# Patient Record
Sex: Female | Born: 2000 | Race: Black or African American | Hispanic: No | Marital: Single | Smoking: Current every day smoker
Health system: Southern US, Community
[De-identification: ages and names within clinical notes are randomized; demographics above are authoritative.]

## PROBLEM LIST (undated history)

## (undated) DIAGNOSIS — F909 Attention-deficit hyperactivity disorder, unspecified type: Secondary | ICD-10-CM

## (undated) DIAGNOSIS — Z309 Encounter for contraceptive management, unspecified: Secondary | ICD-10-CM

## (undated) DIAGNOSIS — N898 Other specified noninflammatory disorders of vagina: Principal | ICD-10-CM

## (undated) DIAGNOSIS — A749 Chlamydial infection, unspecified: Secondary | ICD-10-CM

## (undated) HISTORY — DX: Chlamydial infection, unspecified: A74.9

## (undated) HISTORY — DX: Encounter for contraceptive management, unspecified: Z30.9

## (undated) HISTORY — PX: INDUCED ABORTION: SHX677

## (undated) HISTORY — DX: Other specified noninflammatory disorders of vagina: N89.8

## (undated) HISTORY — PX: TONSILLECTOMY: SUR1361

---
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
## 2018-01-01 ENCOUNTER — Encounter (HOSPITAL_COMMUNITY): Payer: Self-pay | Admitting: Emergency Medicine

## 2018-01-01 ENCOUNTER — Other Ambulatory Visit: Payer: Self-pay

## 2018-01-01 ENCOUNTER — Emergency Department (HOSPITAL_COMMUNITY)
Admission: EM | Admit: 2018-01-01 | Discharge: 2018-01-01 | Disposition: A | Discharge status: Home / Self Care | Payer: Medicaid Other | Attending: Emergency Medicine | Admitting: Emergency Medicine

## 2018-01-01 ENCOUNTER — Emergency Department (HOSPITAL_COMMUNITY): Payer: Medicaid Other

## 2018-01-01 DIAGNOSIS — Z5321 Procedure and treatment not carried out due to patient leaving prior to being seen by health care provider: Secondary | ICD-10-CM | POA: Insufficient documentation

## 2018-01-01 DIAGNOSIS — M25511 Pain in right shoulder: Secondary | ICD-10-CM | POA: Diagnosis present

## 2018-01-01 NOTE — ED Notes (Signed)
Pt left AMA. No VS taken. No discharged papers given
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
## 2019-04-30 ENCOUNTER — Other Ambulatory Visit: Payer: Self-pay

## 2019-04-30 ENCOUNTER — Encounter (HOSPITAL_COMMUNITY): Payer: Self-pay

## 2019-04-30 ENCOUNTER — Emergency Department (HOSPITAL_COMMUNITY): Payer: Self-pay

## 2019-04-30 ENCOUNTER — Emergency Department (HOSPITAL_COMMUNITY)
Admission: EM | Admit: 2019-04-30 | Discharge: 2019-04-30 | Disposition: A | Discharge status: Home / Self Care | Payer: Self-pay | Attending: Emergency Medicine | Admitting: Emergency Medicine

## 2019-04-30 DIAGNOSIS — F17290 Nicotine dependence, other tobacco product, uncomplicated: Secondary | ICD-10-CM | POA: Insufficient documentation

## 2019-04-30 DIAGNOSIS — M25512 Pain in left shoulder: Secondary | ICD-10-CM | POA: Insufficient documentation

## 2019-04-30 DIAGNOSIS — Z79899 Other long term (current) drug therapy: Secondary | ICD-10-CM | POA: Insufficient documentation

## 2019-04-30 LAB — PREGNANCY, URINE: Preg Test, Ur: NEGATIVE

## 2019-04-30 NOTE — Discharge Instructions (Addendum)
You were seen today for pain in your left shoulder. Please read and follow all provided instructions.    1. Medications: alternate naprosyn and tylenol for pain control, usual home medications  2. Treatment: rest, ice, drink plenty of fluids, gentle stretching  3. Follow Up: Please followup with orthopedics as directed or your PCP in 1 week if no improvement for discussion of your diagnoses and further evaluation after today's visit; if you do not have a primary care doctor use the resource guide provided to find one; Please return to the ER for worsening symptoms or other concerns
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
## 2019-05-04 ENCOUNTER — Other Ambulatory Visit: Payer: Self-pay

## 2019-05-04 ENCOUNTER — Emergency Department (HOSPITAL_COMMUNITY)
Admission: EM | Admit: 2019-05-04 | Discharge: 2019-05-04 | Disposition: A | Discharge status: Home / Self Care | Payer: HRSA Program | Attending: Emergency Medicine | Admitting: Emergency Medicine

## 2019-05-04 ENCOUNTER — Encounter (HOSPITAL_COMMUNITY): Payer: Self-pay | Admitting: Emergency Medicine

## 2019-05-04 DIAGNOSIS — Z20822 Contact with and (suspected) exposure to covid-19: Secondary | ICD-10-CM

## 2019-05-04 DIAGNOSIS — Z79899 Other long term (current) drug therapy: Secondary | ICD-10-CM | POA: Insufficient documentation

## 2019-05-04 DIAGNOSIS — Z20828 Contact with and (suspected) exposure to other viral communicable diseases: Secondary | ICD-10-CM | POA: Insufficient documentation

## 2019-05-04 DIAGNOSIS — F17290 Nicotine dependence, other tobacco product, uncomplicated: Secondary | ICD-10-CM | POA: Insufficient documentation

## 2019-05-04 DIAGNOSIS — R509 Fever, unspecified: Secondary | ICD-10-CM | POA: Diagnosis present
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
## 2019-05-04 NOTE — Discharge Instructions (Signed)
You are being tested for coronavirus.  Results return in about 2 days.  If positive, you should receive a phone call.  If negative, will not.  Either way, you may check online on MyChart. If results are positive, you will need to stay out of work and continue to quarantine for at least 7 days after symptoms began as long as you have been fever free for 72 hours and your symptoms are improving. If negative, you should remain out of work and out of the public until you are fever free. Return to the emergency room if you develop difficulty breathing, severe shortness of breath, any new, worsening, concerning symptoms.

## 2019-05-04 NOTE — ED Provider Notes (Signed)
[facility] EMERGENCY DEPARTMENT Provider Note   CSN: [id number] Arrival date & time: 05/04/19  1517    History   Chief Complaint Chief Complaint  Patient presents with  . Fever    HPI Kaitlin Levy is a 18 y.o. female presenting for evaluation of fever, body aches, nausea, and diarrhea.  Patient states 2 days ago, she has been having viral symptoms.  She reports subjective fever and generalized body aches.  She reports nausea without vomiting.  She has had nonbloody stools.  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  She has not been taking anything for her symptoms including Tylenol or ibuprofen.  She reports a mild cough which is nonproductive.  She denies ear pain, nasal congestion, loss of taste or smell, sore throat, chest pain, difficulty breathing or shortness of breath, abdominal pain, or urinary symptoms.  She takes ADHD medication daily, no other medicines..  She smokes black and milds.  No one else at home is sick.     HPI  Past Medical History:  Diagnosis Date  . ADHD (attention deficit hyperactivity disorder)    ADD  . Chlamydia 08/31/2016  . Contraceptive management 04/28/2015  . Vaginal discharge 04/28/2015    Patient Active Problem List   Diagnosis Date Noted  . Chlamydia 08/31/2016  . Vaginal discharge 04/28/2015  . Contraceptive management 04/28/2015    Past Surgical History:  Procedure Laterality Date  . TONSILLECTOMY       OB History    Gravida  0   Para  0   Term  0   Preterm  0   AB  0   Living  0     SAB  0   TAB  0   Ectopic  0   Multiple  0   Live Births               Home Medications    Prior to Admission medications   Medication Sig Start Date End Date Taking? Authorizing Provider  escitalopram (LEXAPRO) 10 MG tablet Take 10 mg by mouth daily.    [provider]  ibuprofen (ADVIL,MOTRIN) 600 MG tablet Take 1 tablet (600  mg total) by mouth every 6 (six) hours as needed. 12/06/17   Fransico Meadow, PA-C  lisdexamfetamine (VYVANSE) 60 MG capsule Take 60 mg by mouth every morning.    [provider]  Norethindrone-Ethinyl Estradiol-Fe Biphas (LO LOESTRIN FE) 1 MG-10 MCG / 10 MCG tablet Take 1 tablet by mouth daily. Take 1 daily by mouth 04/05/17   Estill Dooms, NP  triamcinolone cream (KENALOG) 0.5 % APPLY TO AFFECTED AREAS AS NEEDED 03/16/17   [provider]    Family History Family History  Problem Relation Age of Onset  . Bipolar disorder Mother   . Arthritis Mother   . Other Mother        back problems  . Hypertension Maternal Grandmother   . Diabetes Maternal Grandmother        borderline  . Arthritis Maternal Grandmother   . COPD Maternal Grandfather   . Cancer Maternal Grandfather        lung    Social History Social History   Tobacco Use  . Smoking status: Current Every Day Smoker    Packs/day: 0.50    Years: 0.50    Pack years: 0.25    Types: Cigars  . Smokeless tobacco:  Never Used  Substance Use Topics  . Alcohol use: No  . Drug use: Yes    Types: Marijuana    Comment: last use yesterday     Allergies   Lactose intolerance (gi)   Review of Systems Review of Systems  Constitutional: Positive for fever (Subjective).  Respiratory: Positive for cough.   Gastrointestinal: Positive for diarrhea and nausea.  Musculoskeletal: Positive for myalgias.  All other systems reviewed and are negative.    Physical Exam Updated Vital Signs BP 126/77 (BP Location: Left Arm)   Pulse 92  Temp 99.7 F (37.6 C) (Oral)   Resp 18   Ht 5\' 1"  (1.549 m)   Wt 70.3 kg   LMP 04/11/2019   SpO2 100% Comment: Simultaneous filing. User may not have seen previous data.  BMI 29.29 kg/m   Physical Exam Vitals signs and nursing note reviewed.  Constitutional:      General: She is not in acute distress.    Appearance: She is well-developed.     Comments: Very  well-appearing on exam.  In no distress.  HENT:     Head: Normocephalic and atraumatic.  Eyes:     Conjunctiva/sclera: Conjunctivae normal.     Pupils: Pupils are equal, round, and reactive to light.  Neck:     Musculoskeletal: Normal range of motion and neck supple.  Cardiovascular:     Rate and Rhythm: Normal rate and regular rhythm.     Pulses: Normal pulses.     Comments: Heart rate normal on my exam at 92. Pulmonary:     Effort: Pulmonary effort is normal. No respiratory distress.     Breath sounds: Normal breath sounds. No wheezing.     Comments: Speaking in full sentences.  Clear lung sounds in all fields.  No wheezing, rales, rhonchi.  No signs of respiratory distress. Abdominal:     General: There is no distension.     Palpations: Abdomen is soft. There is no mass.     Tenderness: There is no abdominal tenderness. There is no guarding or rebound.  Musculoskeletal: Normal range of motion.  Skin:    General: Skin is warm and dry.     Capillary Refill: Capillary refill takes less than 2 seconds.  Neurological:     Mental Status: She is alert and oriented to person, place, and time.      ED Treatments / Results  Labs (all labs ordered are listed, but only abnormal results are displayed) Labs Reviewed  NOVEL CORONAVIRUS, NAA (HOSPITAL ORDER, SEND-OUT TO REF LAB)    EKG None  Radiology No results found.  Procedures Procedures (including critical care time)  Medications Ordered in ED Medications - No data to display   Initial Impression / Assessment and Plan / ED Course  I have reviewed the triage vital signs and the nursing notes.  Pertinent labs & imaging results that were available during my care of the patient were reviewed by me and considered in my medical decision making (see chart for details).        Patient presenting for evaluation 2-day history of viral symptoms.  Physical exam reassuring, she appears nontoxic.  While she was initially  tachycardic upon arrival to the ER, my exam, she is not tachycardic.  She is in no respiratory distress.  Lung sounds are reassuring.  Low suspicion for pneumonia at this time. Consider coronavirus vs other virus.  Will send out covid testing and discussed importance of quarantine.  Discussed symptomatic treatment.  I do not believe x-ray be beneficial this time, because significant shows a pneumonia and is likely a viral pneumonia.  As such, will hold on imaging.  Discussed findings and plan with patient.  Discussed continued symptomatic treatment and return if symptoms worsen.  At this time, patient appears safe for a discharge.  Return precautions given.  Patient states she understands and agrees plan.  Hanley BenKiyla S Golding was evaluated in Emergency Department on 05/04/2019 for the symptoms described in the history of present illness. She was evaluated in the context of the global COVID-19 pandemic, which necessitated consideration that the patient might be at risk for infection with the SARS-CoV-2 virus that causes COVID-19. Institutional protocols and algorithms that pertain to the evaluation of patients at risk for COVID-19 are in a state of rapid change based on information released by regulatory bodies including the CDC and federal and state organizations. These policies and algorithms were followed during the patient's care in the ED.   Final Clinical Impressions(s) / ED Diagnoses   Final diagnoses:  Suspected Covid-19 Virus Infection    ED Discharge Orders    None       Alveria ApleyCaccavale, Marynell Bies, PA-C 05/04/19 2008    Vanetta MuldersZackowski, Scott, MD 05/05/19 [phone]

## 2019-05-08 LAB — NOVEL CORONAVIRUS, NAA (HOSP ORDER, SEND-OUT TO REF LAB; TAT 18-24 HRS): SARS-CoV-2, NAA: NOT DETECTED

## 2019-11-01 ENCOUNTER — Encounter (HOSPITAL_COMMUNITY): Payer: Self-pay | Admitting: *Deleted

## 2019-11-01 ENCOUNTER — Emergency Department (HOSPITAL_COMMUNITY)
Admission: EM | Admit: 2019-11-01 | Discharge: 2019-11-01 | Discharge status: Against Medical Advice | Payer: Medicaid Other | Attending: Emergency Medicine | Admitting: Emergency Medicine

## 2019-11-01 ENCOUNTER — Other Ambulatory Visit: Payer: Self-pay

## 2019-11-01 DIAGNOSIS — Z79899 Other long term (current) drug therapy: Secondary | ICD-10-CM | POA: Insufficient documentation

## 2019-11-01 DIAGNOSIS — F17290 Nicotine dependence, other tobacco product, uncomplicated: Secondary | ICD-10-CM | POA: Insufficient documentation

## 2019-11-01 DIAGNOSIS — F1210 Cannabis abuse, uncomplicated: Secondary | ICD-10-CM | POA: Insufficient documentation

## 2019-11-01 DIAGNOSIS — F332 Major depressive disorder, recurrent severe without psychotic features: Secondary | ICD-10-CM | POA: Insufficient documentation

## 2019-11-01 DIAGNOSIS — Z20828 Contact with and (suspected) exposure to other viral communicable diseases: Secondary | ICD-10-CM | POA: Insufficient documentation

## 2019-11-01 DIAGNOSIS — R45851 Suicidal ideations: Secondary | ICD-10-CM | POA: Insufficient documentation

## 2019-11-01 DIAGNOSIS — R4689 Other symptoms and signs involving appearance and behavior: Secondary | ICD-10-CM | POA: Insufficient documentation

## 2019-11-01 LAB — CBC WITH DIFFERENTIAL/PLATELET
Abs Immature Granulocytes: 0.03 10*3/uL (ref 0.00–0.07)
Basophils Absolute: 0.1 10*3/uL (ref 0.0–0.1)
Basophils Relative: 1 %
Eosinophils Absolute: 0.1 10*3/uL (ref 0.0–0.5)
Eosinophils Relative: 1 %
HCT: 39.6 % (ref 36.0–46.0)
Hemoglobin: 13 g/dL (ref 12.0–15.0)
Immature Granulocytes: 0 %
Lymphocytes Relative: 24 %
Lymphs Abs: 2.3 10*3/uL (ref 0.7–4.0)
MCH: 26.9 pg (ref 26.0–34.0)
MCHC: 32.8 g/dL (ref 30.0–36.0)
MCV: 82 fL (ref 80.0–100.0)
Monocytes Absolute: 0.7 10*3/uL (ref 0.1–1.0)
Monocytes Relative: 7 %
Neutro Abs: 6.6 10*3/uL (ref 1.7–7.7)
Neutrophils Relative %: 67 %
Platelets: 273 10*3/uL (ref 150–400)
RBC: 4.83 MIL/uL (ref 3.87–5.11)
RDW: 13.8 % (ref 11.5–15.5)
WBC: 9.7 10*3/uL (ref 4.0–10.5)
nRBC: 0 % (ref 0.0–0.2)

## 2019-11-01 LAB — COMPREHENSIVE METABOLIC PANEL
ALT: 15 U/L (ref 0–44)
AST: 14 U/L — ABNORMAL LOW (ref 15–41)
Albumin: 4.3 g/dL (ref 3.5–5.0)
Alkaline Phosphatase: 44 U/L (ref 38–126)
Anion gap: 10 (ref 5–15)
BUN: 17 mg/dL (ref 6–20)
CO2: 25 mmol/L (ref 22–32)
Calcium: 9.1 mg/dL (ref 8.9–10.3)
Chloride: 103 mmol/L (ref 98–111)
Creatinine, Ser: 0.71 mg/dL (ref 0.44–1.00)
GFR calc Af Amer: >60 mL/min (ref >60)
GFR calc non Af Amer: >60 mL/min (ref >60)
Glucose, Bld: 102 mg/dL — ABNORMAL HIGH (ref 70–99)
Potassium: 3.7 mmol/L (ref 3.5–5.1)
Sodium: 138 mmol/L (ref 135–145)
Total Bilirubin: 0.5 mg/dL (ref 0.3–1.2)
Total Protein: 7.2 g/dL (ref 6.5–8.1)

## 2019-11-01 LAB — RAPID URINE DRUG SCREEN, HOSP PERFORMED
Amphetamines: POSITIVE — AB
Barbiturates: NOT DETECTED
Benzodiazepines: NOT DETECTED
Cocaine: NOT DETECTED
Opiates: NOT DETECTED
Tetrahydrocannabinol: POSITIVE — AB

## 2019-11-01 LAB — SARS CORONAVIRUS 2 (TAT 6-24 HRS): SARS Coronavirus 2: NEGATIVE

## 2019-11-01 LAB — I-STAT BETA HCG BLOOD, ED (MC, WL, AP ONLY): I-stat hCG, quantitative: <5 m[IU]/mL (ref <5)

## 2019-11-01 LAB — ETHANOL: Alcohol, Ethyl (B): <10 mg/dL (ref <10)

## 2019-11-01 MED ORDER — ESCITALOPRAM OXALATE 10 MG PO TABS
10.0000 mg | ORAL_TABLET | Freq: Every day | ORAL | Status: DC
  Administered 2019-11-01: 10 mg via ORAL
  Filled 2019-11-01: qty 1
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
## 2019-11-01 NOTE — ED Notes (Signed)
TTS now 
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
## 2019-11-01 NOTE — ED Notes (Signed)
Pt mad about having to stay, EDP informed. [facility] called and states pt can sign out AMA.
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
## 2019-11-01 NOTE — Progress Notes (Addendum)
Patient ID: Kaitlin Levy, female   DOB: May 14, 2001, 18 y.o.   MRN: [id number]   TTS has discussed this patient by this provider. Patient denies any SI, HI or AVH. She does have a psychiatric history and endorse a number of recent stressors that includes a recent abortion, losing her job, and a break-up with her boyfriend. She has a history of depression and reports she stopped taking her depression medication. As per TTS counselor, per collateral information from patients grandmother, grandmother endorses concerns that patient has not been taking her medication and that she has mood swings.   At this time, I have recommended overnight observation and patient can be reassessed by psychiatry int he morning. I have restarted her Lexapro 10 mg po daily. Patient can be transferred to [facility] observation unit pending negative COVID.  Orders for admission has been placed for the observation unit if this is to occur later today.

## 2019-11-05 ENCOUNTER — Telehealth: Payer: Self-pay | Admitting: Advanced Practice Midwife

## 2019-11-05 NOTE — Telephone Encounter (Signed)
Tried to reach the patient to remind her of her appointment/restrictions.  Also, that Kaitlin Levy will not be here.  Moved her appointment to 3:30pm with Manus Gunning.  Mailbox not setup.

## 2019-11-06 ENCOUNTER — Ambulatory Visit: Payer: Medicaid Other | Admitting: Adult Health

## 2019-11-06 ENCOUNTER — Ambulatory Visit (INDEPENDENT_AMBULATORY_CARE_PROVIDER_SITE_OTHER): Payer: Self-pay | Admitting: Advanced Practice Midwife

## 2019-11-06 ENCOUNTER — Other Ambulatory Visit (HOSPITAL_COMMUNITY)
Admission: RE | Admit: 2019-11-06 | Discharge: 2019-11-06 | Disposition: A | Discharge status: Home / Self Care | Payer: Medicaid Other | Source: Ambulatory Visit | Attending: Adult Health | Admitting: Adult Health

## 2019-11-06 ENCOUNTER — Encounter: Payer: Self-pay | Admitting: Advanced Practice Midwife

## 2019-11-06 ENCOUNTER — Other Ambulatory Visit: Payer: Self-pay

## 2019-11-06 VITALS — BP 117/77 | HR 92 | Ht 62.0 in | Wt 149.0 lb

## 2019-11-06 DIAGNOSIS — N898 Other specified noninflammatory disorders of vagina: Secondary | ICD-10-CM | POA: Insufficient documentation

## 2019-11-06 DIAGNOSIS — R102 Pelvic and perineal pain: Secondary | ICD-10-CM | POA: Insufficient documentation

## 2019-11-06 DIAGNOSIS — Z79899 Other long term (current) drug therapy: Secondary | ICD-10-CM | POA: Insufficient documentation

## 2019-11-06 MED ORDER — SIMETHICONE 80 MG PO CHEW: 80.0000 mg | CHEWABLE_TABLET | Freq: Four times a day (QID) | ORAL | 0 refills | Status: DC | PRN

## 2019-11-06 MED ORDER — NORETHIN-ETH ESTRAD-FE BIPHAS 1 MG-10 MCG / 10 MCG PO TABS: 1.0000 | ORAL_TABLET | Freq: Every day | ORAL | 11 refills | Status: DC

## 2019-11-06 NOTE — Progress Notes (Signed)
[facility] Visit  Patient name: Kaitlin Levy MRN [id number]  Date of birth: [birth year]  CC & HPI:  Kaitlin Levy is a 18 y.o. African American female presenting today for intermittent lower abdominal pain and thicker vaginal discharge, about a week. Had TAB in October, has had normal period since.  Last intercourse in October.  Not on BC, would like to start COCs.  Period supposed to start this week.  Pain happens a few times a day, mainly at night, none today. Feels a little like gas bubbles.  BMs normal No UTI sx.    Pertinent History Reviewed:  Medical & Surgical Hx:   Past Medical History:  Diagnosis Date  . ADHD (attention deficit hyperactivity disorder)    ADD  . Chlamydia 08/31/2016  . Contraceptive management 04/28/2015  . Vaginal discharge 04/28/2015   Past Surgical History:  Procedure Laterality Date  . TONSILLECTOMY     Family History  Problem Relation Age of Onset  . Bipolar disorder Mother   . Arthritis Mother   . Other Mother        back problems  . Hypertension Maternal Grandmother   . Diabetes Maternal Grandmother        borderline  . Arthritis Maternal Grandmother   . COPD Maternal Grandfather   . Cancer Maternal Grandfather        lung    Current Outpatient Medications:  .  ADDERALL XR 30 MG 24 hr capsule, Take 30 mg by mouth daily., Disp: , Rfl:  .  ibuprofen (ADVIL,MOTRIN) 600 MG tablet, Take 1 tablet (600 mg total) by mouth every 6 (six) hours as needed., Disp: 30 tablet, Rfl: 0 .  escitalopram (LEXAPRO) 10 MG tablet, Take 10 mg by mouth daily., Disp: , Rfl:  .  lisdexamfetamine (VYVANSE) 60 MG capsule, Take 60 mg by mouth every morning., Disp: , Rfl:  .  Norethindrone-Ethinyl Estradiol-Fe Biphas (LO LOESTRIN FE) 1 MG-10 MCG / 10 MCG tablet, Take 1 tablet by mouth daily., Disp: 1 Package, Rfl: 11 .  simethicone (GAS-X) 80 MG chewable tablet, Chew 1 tablet (80 mg total) by mouth every 6 (six) hours as needed for flatulence., Disp: 30 tablet,  Rfl: 0 .  triamcinolone cream (KENALOG) 0.5 %, APPLY TO AFFECTED AREAS AS NEEDED, Disp: , Rfl: 2 Social History: Reviewed -  reports that she has been smoking cigars. She has a 0.25 pack-year smoking history. She has never used smokeless tobacco.  Review of Systems:   Constitutional: Negative for fever and chills Eyes: Negative for visual disturbances Respiratory: Negative for shortness of breath, dyspnea Cardiovascular: Negative for chest pain or palpitations  Gastrointestinal: Negative for vomiting, diarrhea and constipation; no abdominal pain Genitourinary: Negative for dysuria and urgency, vaginal irritation or itching Musculoskeletal: Negative for back pain, joint pain, myalgias  Neurological: Negative for dizziness and headaches    Objective Findings:    Physical Examination: Vitals:   11/06/19 1538  BP: 117/77  Pulse: 92   U/a dipped negative.   General appearance - well appearing, and in no distress Mental status - alert, oriented to person, place, and time Chest:  Normal respiratory effort Heart - normal rate and regular rhythm Abdomen:  Soft, nontender Pelvic: SSE:  Normal appearing DC w/o odor.  Wet prep neg Non tender to bimanual, no CMT.  Musculoskeletal:  Normal range of motion without pain Extremities:  No edema    No results found for this or any previous visit (from the past 24  hour(s)).    Assessment & Plan:  A:   Intermittent LA pain, possible GI (gas) in nature P:  R/O STD  Try mylicon for a few days to see if that helps.     Return for If you have any problems.  Christin Fudge CNM 11/06/2019 4:19 PM

## 2019-11-08 LAB — CERVICOVAGINAL ANCILLARY ONLY
Chlamydia: NEGATIVE
Comment: NEGATIVE
Comment: NORMAL
Neisseria Gonorrhea: NEGATIVE

## 2020-01-10 ENCOUNTER — Telehealth: Payer: Self-pay | Admitting: Adult Health

## 2020-01-10 NOTE — Telephone Encounter (Signed)
Tried to reach the patient to remind her of her appointment/restrictions, mailbox is not set up. 

## 2020-01-14 ENCOUNTER — Encounter: Payer: Self-pay | Admitting: Adult Health

## 2020-01-14 ENCOUNTER — Other Ambulatory Visit (HOSPITAL_COMMUNITY)
Admission: RE | Admit: 2020-01-14 | Discharge: 2020-01-14 | Disposition: A | Discharge status: Home / Self Care | Payer: Medicaid Other | Source: Ambulatory Visit | Attending: Adult Health | Admitting: Adult Health

## 2020-01-14 ENCOUNTER — Other Ambulatory Visit: Payer: Self-pay

## 2020-01-14 ENCOUNTER — Ambulatory Visit (INDEPENDENT_AMBULATORY_CARE_PROVIDER_SITE_OTHER): Payer: Self-pay | Admitting: Adult Health

## 2020-01-14 VITALS — BP 109/68 | HR 90 | Ht 61.0 in | Wt 151.0 lb

## 2020-01-14 DIAGNOSIS — N898 Other specified noninflammatory disorders of vagina: Secondary | ICD-10-CM | POA: Insufficient documentation

## 2020-01-14 DIAGNOSIS — Z113 Encounter for screening for infections with a predominantly sexual mode of transmission: Secondary | ICD-10-CM | POA: Diagnosis not present

## 2020-01-14 DIAGNOSIS — N760 Acute vaginitis: Secondary | ICD-10-CM | POA: Insufficient documentation

## 2020-01-14 DIAGNOSIS — B9689 Other specified bacterial agents as the cause of diseases classified elsewhere: Secondary | ICD-10-CM

## 2020-01-14 HISTORY — DX: Other specified bacterial agents as the cause of diseases classified elsewhere: B96.89

## 2020-01-14 HISTORY — DX: Other specified bacterial agents as the cause of diseases classified elsewhere: N76.0

## 2020-01-14 LAB — POCT WET PREP (WET MOUNT)
Clue Cells Wet Prep Whiff POC: NEGATIVE
WBC, Wet Prep HPF POC: POSITIVE

## 2020-01-14 MED ORDER — METRONIDAZOLE 500 MG PO TABS: 500.0000 mg | ORAL_TABLET | Freq: Two times a day (BID) | ORAL | 0 refills | Status: DC

## 2020-01-14 NOTE — Progress Notes (Signed)
  Subjective:     Patient ID: Kaitlin Levy, female   DOB: 15-Aug-2001, 19 y.o.   MRN: [id number]  HPI Kaitlin Levy is a 19 year old black female, single, G1P0010, in complaining of vaginal discharge.  Review of Systems  Fishy white chunky discharge, for about a week after using Darene Lamer  Reviewed past medical,surgical, social and family history. Reviewed medications and allergies.     Objective:   Physical Exam BP 109/68 (BP Location: Left Arm, Patient Position: Sitting, Cuff Size: Normal)   Pulse 90   Ht 5\' 1"  (1.549 m)   Wt 151 lb (68.5 kg)   LMP 12/20/2019 (Approximate)   BMI 28.53 kg/m    Fall risk is low . Skin warm and dry.Pelvic: external genitalia is normal in appearance no lesions, vagina: white discharge without odor,urethra has no lesions or masses noted, cervix:smooth, nulliparous, uterus: normal size, shape and contour, non tender, no masses felt, adnexa: no masses or tenderness noted. Bladder is non tender and no masses felt. Wet prep: + for clue cells and +WBCs. CV swab obtained by Dr 02/17/2020.  Assessment:     1. Vaginal discharge  2. Vaginal itching   3. BV (bacterial vaginosis) Will rx flagyl, no alcohol or sex during treatment  Meds ordered this encounter  Medications  . metroNIDAZOLE (FLAGYL) 500 MG tablet    Sig: Take 1 tablet (500 mg total) by mouth 2 (two) times daily.    Dispense:  14 tablet    Refill:  0    Order Specific Question:   Supervising Provider    Answer:   Leticia Penna, LUTHER H [[id number]]    4. Screening examination for STD (sexually transmitted disease) CV swab sent for GC/CHL and trich     Plan:     Return in august for pap and physicao

## 2020-01-16 LAB — CERVICOVAGINAL ANCILLARY ONLY
Chlamydia: NEGATIVE
Comment: NEGATIVE
Comment: NEGATIVE
Comment: NORMAL
Neisseria Gonorrhea: NEGATIVE
Trichomonas: NEGATIVE

## 2020-01-17 ENCOUNTER — Telehealth: Payer: Self-pay | Admitting: *Deleted

## 2020-01-17 NOTE — Telephone Encounter (Signed)
-----   Message from Adline Potter, NP sent at 01/17/2020  2:56 PM EST ----- Let pt know negative GC/chlamydia and trich

## 2020-01-17 NOTE — Telephone Encounter (Signed)
Pt aware CV swab was negative for trich, GC/CHL. Pt voiced understanding. JSY

## 2020-01-30 ENCOUNTER — Ambulatory Visit: Payer: Self-pay

## 2020-02-07 ENCOUNTER — Ambulatory Visit: Payer: Medicaid Other | Attending: Internal Medicine

## 2020-02-07 ENCOUNTER — Other Ambulatory Visit: Payer: Self-pay

## 2020-02-07 DIAGNOSIS — Z20822 Contact with and (suspected) exposure to covid-19: Secondary | ICD-10-CM

## 2020-02-09 LAB — NOVEL CORONAVIRUS, NAA: SARS-CoV-2, NAA: NOT DETECTED

## 2020-02-09 LAB — SARS-COV-2, NAA 2 DAY TAT

## 2020-02-10 ENCOUNTER — Encounter (HOSPITAL_COMMUNITY): Payer: Self-pay | Admitting: Emergency Medicine

## 2020-02-10 ENCOUNTER — Emergency Department (HOSPITAL_COMMUNITY)
Admission: EM | Admit: 2020-02-10 | Discharge: 2020-02-10 | Disposition: A | Discharge status: Home / Self Care | Payer: HRSA Program | Attending: Emergency Medicine | Admitting: Emergency Medicine

## 2020-02-10 ENCOUNTER — Other Ambulatory Visit: Payer: Self-pay

## 2020-02-10 DIAGNOSIS — F17210 Nicotine dependence, cigarettes, uncomplicated: Secondary | ICD-10-CM | POA: Insufficient documentation

## 2020-02-10 DIAGNOSIS — Z79899 Other long term (current) drug therapy: Secondary | ICD-10-CM | POA: Diagnosis not present

## 2020-02-10 DIAGNOSIS — M791 Myalgia, unspecified site: Secondary | ICD-10-CM | POA: Diagnosis present

## 2020-02-10 DIAGNOSIS — U071 COVID-19: Secondary | ICD-10-CM | POA: Diagnosis not present

## 2020-02-10 DIAGNOSIS — F909 Attention-deficit hyperactivity disorder, unspecified type: Secondary | ICD-10-CM | POA: Diagnosis not present

## 2020-02-10 DIAGNOSIS — Z20822 Contact with and (suspected) exposure to covid-19: Secondary | ICD-10-CM

## 2020-02-10 NOTE — ED Provider Notes (Signed)
[facility] EMERGENCY DEPARTMENT Provider Note   CSN: [id number] Arrival date & time: 02/10/20  2022     History Chief Complaint  Patient presents with  . Generalized Body Aches    Kaitlin Levy is a 19 y.o. female presenting for evaluation of generalized body aches, loss of taste and smell, and intermittent fevers.  Patient states her symptoms began 6 days ago.  Initially she noticed generalized body aches.  She has had intermittent hot flashes/woken up sweating, but has not checked her temperature.  Today she developed loss of taste and smell.  She reports decreased appetite.  She denies ear pain, sore throat, chest pain, shortness of breath, cough, abdominal pain, urinary symptoms, abnormal bowel movements.  She has been taking Tylenol, ibuprofen, Benadryl to help with her symptoms.  She is not tried anything else.  She states her boyfriend is Covid positive, she was helping take care of him last week for her symptoms began.  She denies history of asthma or COPD.  She denies tobacco use.  She has no other medical problems, takes no medications daily.  HPI     Past Medical History:  Diagnosis Date  . ADHD (attention deficit hyperactivity disorder)    ADD  . Chlamydia 08/31/2016  . Contraceptive management 04/28/2015  . Vaginal discharge 04/28/2015    Patient Active Problem List   Diagnosis Date Noted  . Vaginal itching 01/14/2020  . BV (bacterial vaginosis) 01/14/2020  . Chlamydia 08/31/2016  . Vaginal discharge 04/28/2015  . Contraceptive management 04/28/2015    Past Surgical History:  Procedure Laterality Date  . TONSILLECTOMY       OB History    Gravida  1   Para      Term      Preterm      AB  1   Living        SAB      TAB      Ectopic      Multiple      Live Births              Family History  Problem Relation Age of Onset  . Bipolar disorder Mother   . Arthritis Mother   . Other Mother        back problems  . Hypertension  Maternal Grandmother   . Diabetes Maternal Grandmother        borderline  . Arthritis Maternal Grandmother   . COPD Maternal Grandfather   . Cancer Maternal Grandfather        lung    Social History   Tobacco Use  . Smoking status: Current Every Day Smoker    Packs/day: 0.50    Years: 0.50    Pack years: 0.25    Types: E-cigarettes  . Smokeless tobacco: Never Used  Substance Use Topics  . Alcohol use: Yes    Comment: occ  . Drug use: Yes    Types: Marijuana    Comment: daily    Home Medications Prior to Admission medications   Medication Sig Start Date End Date Taking? Authorizing Provider  ADDERALL XR 30 MG 24 hr capsule Take 30 mg by mouth daily. 10/30/19   [provider]  ibuprofen (ADVIL,MOTRIN) 600 MG tablet Take 1 tablet (600 mg total) by mouth every 6 (six) hours as needed. 12/06/17   Elson Areas, PA-C  metroNIDAZOLE (FLAGYL) 500 MG tablet Take 1 tablet (500 mg total) by mouth 2 (two) times daily. 01/14/20  Cyril Mourning A, NP  simethicone (GAS-X) 80 MG chewable tablet Chew 1 tablet (80 mg total) by mouth every 6 (six) hours as needed for flatulence. 11/06/19   Cresenzo-Dishmon, Scarlette Calico, CNM  triamcinolone cream (KENALOG) 0.5 % APPLY TO AFFECTED AREAS AS NEEDED 03/16/17   [provider]    Allergies    Lactose intolerance (gi)  Review of Systems   Review of Systems  Constitutional: Positive for fever (Subjective).  HENT:       Loss of taste and smell  Musculoskeletal: Positive for myalgias.  All other systems reviewed and are negative.   Physical Exam Updated Vital Signs BP 116/68 (BP Location: Right Arm)   Pulse 75   Temp 98.5 F (36.9 C) (Oral)   Resp 17   Ht 5\' 1"  (1.549 m)   Wt 70.3 kg   SpO2 100%   BMI 29.29 kg/m   Physical Exam Vitals and nursing note reviewed.  Constitutional:      General: She is not in acute distress.    Appearance: She is well-developed.     Comments: Resting comfortably in the bed in no acute  distress  HENT:     Head: Normocephalic and atraumatic.  Cardiovascular:     Rate and Rhythm: Normal rate and regular rhythm.     Pulses: Normal pulses.  Pulmonary:     Effort: Pulmonary effort is normal.     Breath sounds: Normal breath sounds.     Comments: Clear lung sounds in all fields.  Speaking full sentences.  No signs of accessory muscle use.  Sats stable on room air. Abdominal:     General: There is no distension.     Palpations: There is no mass.     Tenderness: There is no abdominal tenderness. There is no guarding or rebound.  Musculoskeletal:        General: Normal range of motion.     Cervical back: Normal range of motion.  Skin:    General: Skin is warm.     Findings: No rash.  Neurological:     Mental Status: She is alert and oriented to person, place, and time.     ED Results / Procedures / Treatments   Labs (all labs ordered are listed, but only abnormal results are displayed) Labs Reviewed  SARS CORONAVIRUS 2 (TAT 6-24 HRS)    EKG None  Radiology No results found.  Procedures Procedures (including critical care time)  Medications Ordered in ED Medications - No data to display  ED Course  I have reviewed the triage vital signs and the nursing notes.  Pertinent labs & imaging results that were available during my care of the patient were reviewed by me and considered in my medical decision making (see chart for details).    MDM Rules/Calculators/A&P                      Patient presenting for evaluation of 6-day history of covid-like sxs.  She had a recent Covid exposure.  She is without significant cough or shortness of breath.  I do not believe she needs a chest x-ray or further pulmonary work-up at this time.  Sats are reassuring.  I do not like she needs admission to the hospital.  Due to loss of taste and smell, I have a high suspicion for Covid.  Will perform Covid test, and instructed patient to quarantine.  Discussed that she should  quarantine regardless of test results, as this is most  likely Covid.  Discussed continued symptomatic treatment, prompt return to the ER with any worsening respiratory status.  At this time, patient appears safe for discharge.  Return precautions given.  Patient states she understands and agrees to plan.  Kaitlin Levy was evaluated in Emergency Department on 02/10/2020 for the symptoms described in the history of present illness. She was evaluated in the context of the global COVID-19 pandemic, which necessitated consideration that the patient might be at risk for infection with the SARS-CoV-2 virus that causes COVID-19. Institutional protocols and algorithms that pertain to the evaluation of patients at risk for COVID-19 are in a state of rapid change based on information released by regulatory bodies including the CDC and federal and state organizations. These policies and algorithms were followed during the patient's care in the ED.  Final Clinical Impression(s) / ED Diagnoses Final diagnoses:  Suspected COVID-19 virus infection  Close exposure to COVID-19 virus    Rx / DC Orders ED Discharge Orders    None       Franchot Heidelberg, PA-C 02/10/20 2107    Nat Christen, MD 02/11/20 2118

## 2020-02-10 NOTE — ED Triage Notes (Signed)
Pt was exposed to COVID on Monday 02/04/2020. Pt C/O body aches and loss of taste and smell. Denies cough and SOB.

## 2020-02-10 NOTE — Discharge Instructions (Signed)
You likely have a Covid.  This should be treated symptomatically. You should quarantine for a total of 10 days from symptom onset (since last Tuesday).  You may end quarantine if you are fever free for 24 hours and your symptoms are not worsening. Your Covid test is pending.  If positive, you receive a phone call.  If negative, you will not.  Either way, you may check online MyChart. Use Zofran as needed for nausea vomiting. Use Tylenol or ibuprofen as needed for fevers or body aches. Make sure you stay well-hydrated with water. Wash your hands frequently to prevent spread of infection. Monitor for worsening signs of breathing.  If you are having significant difficulty breathing, return to the emergency room for further evaluation. Return to the emergency room if you develop chest pain, difficulty breathing, or any new or worsening symptoms.

## 2020-02-11 LAB — SARS CORONAVIRUS 2 (TAT 6-24 HRS): SARS Coronavirus 2: POSITIVE — AB
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
## 2020-07-17 ENCOUNTER — Ambulatory Visit: Payer: Medicaid Other | Admitting: Adult Health

## 2020-08-21 ENCOUNTER — Other Ambulatory Visit: Payer: Medicaid Other | Admitting: Adult Health

## 2021-07-08 ENCOUNTER — Ambulatory Visit (INDEPENDENT_AMBULATORY_CARE_PROVIDER_SITE_OTHER): Payer: Medicaid Other | Admitting: Advanced Practice Midwife

## 2021-07-08 ENCOUNTER — Other Ambulatory Visit: Payer: Self-pay

## 2021-07-08 ENCOUNTER — Other Ambulatory Visit (HOSPITAL_COMMUNITY)
Admission: RE | Admit: 2021-07-08 | Discharge: 2021-07-08 | Disposition: A | Discharge status: Home / Self Care | Payer: Medicaid Other | Source: Ambulatory Visit | Attending: Advanced Practice Midwife | Admitting: Advanced Practice Midwife

## 2021-07-08 ENCOUNTER — Encounter: Payer: Self-pay | Admitting: Advanced Practice Midwife

## 2021-07-08 VITALS — BP 101/66 | HR 79 | Ht 62.25 in | Wt 173.8 lb

## 2021-07-08 DIAGNOSIS — Z3009 Encounter for other general counseling and advice on contraception: Secondary | ICD-10-CM | POA: Insufficient documentation

## 2021-07-08 DIAGNOSIS — Z30011 Encounter for initial prescription of contraceptive pills: Secondary | ICD-10-CM

## 2021-07-08 DIAGNOSIS — Z0000 Encounter for general adult medical examination without abnormal findings: Secondary | ICD-10-CM

## 2021-07-08 DIAGNOSIS — Z01419 Encounter for gynecological examination (general) (routine) without abnormal findings: Secondary | ICD-10-CM | POA: Diagnosis not present

## 2021-07-08 MED ORDER — NORETHIN ACE-ETH ESTRAD-FE 1-20 MG-MCG PO TABS: 1.0000 | ORAL_TABLET | Freq: Every day | ORAL | 11 refills | Status: DC

## 2021-07-08 NOTE — Progress Notes (Signed)
WELL-WOMAN EXAMINATION Patient name: Kaitlin Levy MRN [id number]  Date of birth: 12-18-2000 Chief Complaint:   Gynecologic Exam (Family Planning )  History of Present Illness:   Kaitlin Levy is a 20 y.o. G43P0010 African-American female being seen today for a routine well-woman exam.  Current complaints: concerned re a 'bump' in her L groin that appeared approx 1 month ago and resolved within a week; no fluid came from it, sl tender; wants to start on OCPs; no sex since prior to her LMP of 06/28/21  PCP: none      does desire labs Patient's last menstrual period was 06/28/2021. The current method of family planning is  none, but desires OCPs .  Last pap none prior.  Last mammogram: never. Family h/o breast cancer: no Last colonoscopy: never. Family h/o colorectal cancer: no  Depression screen [facility] 2/9 07/08/2021 10/04/2016 08/24/2016  Decreased Interest 1 0 1  Down, Depressed, Hopeless 1 0 1  PHQ - 2 Score 2 0 2  Altered sleeping 0 1 2  Tired, decreased energy 1 1 2   Change in appetite 0 0 1  Feeling bad or failure about yourself  1 0 1  Trouble concentrating 1 0 2  Moving slowly or fidgety/restless 0 0 0  Suicidal thoughts 0 0 0  PHQ-9 Score 5 2 10      GAD 7 : Generalized Anxiety Score 07/08/2021  Nervous, Anxious, on Edge 0  Control/stop worrying 1  Worry too much - different things 1  Trouble relaxing 1  Restless 1  Easily annoyed or irritable 1  Afraid - awful might happen 1  Total GAD 7 Score 6     Review of Systems:   Pertinent items are noted in HPI Denies any headaches, blurred vision, fatigue, shortness of breath, chest pain, abdominal pain, abnormal vaginal discharge/itching/odor/irritation, problems with periods, bowel movements, urination, or intercourse unless otherwise stated above. Pertinent History Reviewed:  Reviewed past medical,surgical, social and family history.  Reviewed problem list, medications and allergies. Physical Assessment:   Vitals:    07/08/21 1052  BP: 101/66  Pulse: 79  Weight: 173 lb 12.8 oz (78.8 kg)  Height: 5' 2.25" (1.581 m)  Body mass index is 31.53 kg/m.        Physical Examination:   General appearance - well appearing, and in no distress  Mental status - alert, oriented to person, place, and time  Psych:  She has a normal mood and affect  Skin - warm and dry, normal color, no suspicious lesions noted  Chest - effort normal, all lung fields clear to auscultation bilaterally  Heart - normal rate and regular rhythm  Neck:  midline trachea, no thyromegaly or nodules  Breasts - breasts appear normal, no suspicious masses, no skin or nipple changes or  axillary nodes  Abdomen - soft, nontender, nondistended, no masses or organomegaly  Pelvic - VULVA: normal appearing vulva with no masses, tenderness or lesions  VAGINA: normal appearing vagina with normal color and discharge, no lesions  CERVIX: normal appearing cervix without discharge or lesions, no CMT  Thin prep pap is done without HR HPV cotesting  UTERUS: uterus is felt to be normal size, shape, consistency and nontender   ADNEXA: No adnexal masses or tenderness noted.  Extremities:  No swelling or varicosities noted  Chaperone: 07/10/2021    No results found for this or any previous visit (from the past 24 hour(s)).  Assessment & Plan:  1) Well-Woman Exam  2)  New OCP start  Labs/procedures today: GC/chlam/Pap, RPR/HIV  Mammogram: @ 20yo, or sooner if problems Colonoscopy: @ 20yo, or sooner if problems  Orders Placed This Encounter  Procedures   HIV Antibody (routine testing w rflx)   RPR    Meds:  Meds ordered this encounter  Medications   norethindrone-ethinyl estradiol-FE (JUNEL FE 1/20) 1-20 MG-MCG tablet    Sig: Take 1 tablet by mouth daily.    Dispense:  28 tablet    Refill:  11    Order Specific Question:   Supervising Provider    Answer:   Lazaro Arms [[id number]]    Follow-up: Return in about 3 months (around 10/08/2021)  for contraception f/u.  Arabella Merles CNM 07/08/2021 1:26 PM

## 2021-07-09 LAB — CYTOLOGY - PAP
Adequacy: ABSENT
Chlamydia: NEGATIVE
Comment: NEGATIVE
Comment: NORMAL
Diagnosis: NEGATIVE
Neisseria Gonorrhea: NEGATIVE

## 2021-07-09 LAB — HIV ANTIBODY (ROUTINE TESTING W REFLEX): HIV Screen 4th Generation wRfx: NONREACTIVE

## 2021-07-09 LAB — RPR: RPR Ser Ql: NONREACTIVE

## 2021-07-27 ENCOUNTER — Telehealth: Payer: Self-pay | Admitting: Women's Health

## 2021-07-27 MED ORDER — METRONIDAZOLE 500 MG PO TABS: 500.0000 mg | ORAL_TABLET | Freq: Two times a day (BID) | ORAL | 0 refills | Status: DC

## 2021-07-27 NOTE — Telephone Encounter (Signed)
Kaitlin Levy requests meds for BV will rx flagyl

## 2021-07-27 NOTE — Telephone Encounter (Signed)
Patient wanted to know if something could be called in for bv. She was last seen in office on 07/08/2021

## 2021-08-04 ENCOUNTER — Telehealth: Payer: Self-pay

## 2021-08-04 MED ORDER — CLINDAMYCIN PHOSPHATE 100 MG VA SUPP: 100.0000 mg | Freq: Every day | VAGINAL | 0 refills | Status: DC

## 2021-08-04 NOTE — Telephone Encounter (Signed)
Pt called during closed hours stating that she had an allergic reaction to metronidazole for BV. Her face was breaking out. Wants different prescription.  Called pt this morning, no answer, vm not setup so no msg left. Mychart msg sent to pt.

## 2021-08-04 NOTE — Telephone Encounter (Signed)
Had reaction to flagyl will rx clindamycin 100 mg supp #3

## 2021-08-04 NOTE — Telephone Encounter (Signed)
Pt returned my call. Pt had not read Mychart msg yet. Two identifiers used. Pt stated that she developed hives, itching (relieved by Benadryl), tongue swelling, and diarrhea. Pt instructed not to take any more medication. Medication added to pt's allergy list. Informed pt that her concerns were sent to [facility]. Pt requested Mychart communication since she's unable to answer her phone while at work.

## 2021-08-12 ENCOUNTER — Other Ambulatory Visit: Payer: Self-pay | Admitting: Adult Health

## 2021-10-05 ENCOUNTER — Ambulatory Visit: Payer: Medicaid Other | Admitting: Adult Health

## 2022-01-08 ENCOUNTER — Other Ambulatory Visit: Payer: Self-pay

## 2022-01-08 ENCOUNTER — Ambulatory Visit
Admission: EM | Admit: 2022-01-08 | Discharge: 2022-01-08 | Disposition: A | Discharge status: Home / Self Care | Payer: Self-pay | Attending: Family Medicine | Admitting: Family Medicine

## 2022-01-08 ENCOUNTER — Encounter: Payer: Self-pay | Admitting: Emergency Medicine

## 2022-01-08 DIAGNOSIS — R5383 Other fatigue: Secondary | ICD-10-CM

## 2022-01-08 DIAGNOSIS — R519 Headache, unspecified: Secondary | ICD-10-CM

## 2022-01-08 DIAGNOSIS — R0602 Shortness of breath: Secondary | ICD-10-CM

## 2022-01-08 NOTE — ED Triage Notes (Signed)
Feels SOB with headache since this morning.

## 2022-01-08 NOTE — ED Provider Notes (Signed)
[facility]    CSN: [id number] Arrival date & time: 01/08/22  1630      History   Chief Complaint No chief complaint on file.   HPI Kaitlin Levy is a 21 y.o. female.   Presenting today with chest tightness, headache, fatigue since this morning upon waking.  Denies any cough, fever, congestion, sore throat, wheezing, chest pain, abdominal pain, nausea vomiting or diarrhea.  So far does not take anything over-the-counter for symptoms.  No known sick contacts recently.  No known pertinent chronic medical problems.   Past Medical History:  Diagnosis Date   ADHD (attention deficit hyperactivity disorder)    ADD   Chlamydia 08/31/2016   Contraceptive management 04/28/2015   Vaginal discharge 04/28/2015    Patient Active Problem List   Diagnosis Date Noted   BV (bacterial vaginosis) 01/14/2020   Chlamydia 08/31/2016   Contraceptive management 04/28/2015    Past Surgical History:  Procedure Laterality Date   INDUCED ABORTION     09/2019   TONSILLECTOMY      OB History     Gravida  1   Para      Term      Preterm      AB  1   Living         SAB      IAB      Ectopic      Multiple      Live Births               Home Medications    Prior to Admission medications   Medication Sig Start Date End Date Taking? Authorizing Provider  clindamycin (CLEOCIN) 100 MG vaginal suppository Place 1 suppository (100 mg total) vaginally at bedtime. 08/04/21   Adline Potter, NP  ibuprofen (ADVIL,MOTRIN) 600 MG tablet Take 1 tablet (600 mg total) by mouth every 6 (six) hours as needed. 12/06/17   Elson Areas, PA-C  metroNIDAZOLE (FLAGYL) 500 MG tablet Take 1 tablet (500 mg total) by mouth 2 (two) times daily. 07/27/21   Adline Potter, NP  norethindrone-ethinyl estradiol-FE (JUNEL FE 1/20) 1-20 MG-MCG tablet Take 1 tablet by mouth daily. 07/08/21   Arabella Merles, CNM  triamcinolone cream (KENALOG) 0.5 % APPLY TO AFFECTED AREAS AS NEEDED  03/16/17   [provider]    Family History Family History  Problem Relation Age of Onset   Bipolar disorder Mother    Arthritis Mother    Other Mother        back problems   Hypertension Maternal Grandmother    Diabetes Maternal Grandmother        borderline   Arthritis Maternal Grandmother    COPD Maternal Grandfather    Cancer Maternal Grandfather        lung    Social History Social History   Tobacco Use   Smoking status: Some Days    Packs/day: 0.50    Years: 0.50    Pack years: 0.25    Types: E-cigarettes, Cigarettes   Smokeless tobacco: Never  Vaping Use   Vaping Use: Every day  Substance Use Topics   Alcohol use: Yes    Comment: occ   Drug use: Yes    Types: Marijuana    Comment: daily     Allergies   Metronidazole and Lactose intolerance (gi)   Review of Systems Review of Systems Per HPI  Physical Exam Triage Vital Signs ED Triage Vitals  Enc Vitals Group  BP 01/08/22 1636 119/74     Pulse Rate 01/08/22 1636 87     Resp 01/08/22 1636 18     Temp 01/08/22 1636 98 F (36.7 C)     Temp Source 01/08/22 1636 Oral     SpO2 01/08/22 1636 97 %     Weight --      Height --      Head Circumference --      Peak Flow --      Pain Score 01/08/22 1637 8     Pain Loc --      Pain Edu? --      Excl. in GC? --    No data found.  Updated Vital Signs BP 119/74 (BP Location: Right Arm)    Pulse 87    Temp 98 F (36.7 C) (Oral)    Resp 18    LMP 12/23/2021 (Approximate)    SpO2 97%   Visual Acuity Right Eye Distance:   Left Eye Distance:   Bilateral Distance:    Right Eye Near:   Left Eye Near:    Bilateral Near:     Physical Exam Vitals and nursing note reviewed.  Constitutional:      Appearance: Normal appearance. She is not ill-appearing.  HENT:     Head: Atraumatic.     Nose: Nose normal.     Mouth/Throat:     Mouth: Mucous membranes are moist.  Eyes:     Extraocular Movements: Extraocular movements intact.      Conjunctiva/sclera: Conjunctivae normal.  Cardiovascular:     Rate and Rhythm: Normal rate and regular rhythm.     Heart sounds: Normal heart sounds.  Pulmonary:     Effort: Pulmonary effort is normal.     Breath sounds: Normal breath sounds. No wheezing or rales.  Abdominal:     General: Bowel sounds are normal. There is no distension.     Palpations: Abdomen is soft.     Tenderness: There is no abdominal tenderness. There is no guarding.  Musculoskeletal:        General: Normal range of motion.     Cervical back: Normal range of motion and neck supple.  Skin:    General: Skin is warm and dry.  Neurological:     Mental Status: She is alert and oriented to person, place, and time.  Psychiatric:        Mood and Affect: Mood normal.        Thought Content: Thought content normal.        Judgment: Judgment normal.   UC Treatments / Results  Labs (all labs ordered are listed, but only abnormal results are displayed) Labs Reviewed  COVID-19, FLU A+B NAA    EKG   Radiology No results found.  Procedures Procedures (including critical care time)  Medications Ordered in UC Medications - No data to display  Initial Impression / Assessment and Plan / UC Course  I have reviewed the triage vital signs and the nursing notes.  Pertinent labs & imaging results that were available during my care of the patient were reviewed by me and considered in my medical decision making (see chart for details).     Vital signs benign and reassuring, overall very well-appearing today.  COVID, flu testing pending for rule out, discussed supportive over-the-counter medications, home care.  Work note given.  Return for acutely worsening symptoms  Final Clinical Impressions(s) / UC Diagnoses   Final diagnoses:  Other fatigue  Acute nonintractable  headache, unspecified headache type  SOB (shortness of breath)   Discharge Instructions   None    ED Prescriptions   None    PDMP not  reviewed this encounter.   Particia Nearing, [place] 01/08/22 1757

## 2022-01-09 LAB — COVID-19, FLU A+B NAA
Influenza A, NAA: NOT DETECTED
Influenza B, NAA: NOT DETECTED
SARS-CoV-2, NAA: NOT DETECTED

## 2022-04-16 ENCOUNTER — Ambulatory Visit: Payer: Medicaid Other | Admitting: Adult Health

## 2022-04-23 ENCOUNTER — Other Ambulatory Visit (HOSPITAL_COMMUNITY)
Admission: RE | Admit: 2022-04-23 | Discharge: 2022-04-23 | Disposition: A | Discharge status: Home / Self Care | Payer: Medicaid Other | Source: Ambulatory Visit | Attending: Adult Health | Admitting: Adult Health

## 2022-04-23 ENCOUNTER — Encounter: Payer: Self-pay | Admitting: Obstetrics & Gynecology

## 2022-04-23 ENCOUNTER — Ambulatory Visit (INDEPENDENT_AMBULATORY_CARE_PROVIDER_SITE_OTHER): Payer: Self-pay | Admitting: Obstetrics & Gynecology

## 2022-04-23 VITALS — BP 115/69 | HR 79 | Ht 62.0 in | Wt 170.4 lb

## 2022-04-23 DIAGNOSIS — Z113 Encounter for screening for infections with a predominantly sexual mode of transmission: Secondary | ICD-10-CM | POA: Diagnosis present

## 2022-04-23 DIAGNOSIS — N898 Other specified noninflammatory disorders of vagina: Secondary | ICD-10-CM | POA: Diagnosis present

## 2022-04-23 DIAGNOSIS — F418 Other specified anxiety disorders: Secondary | ICD-10-CM

## 2022-04-23 NOTE — Progress Notes (Signed)
GYN VISIT Patient name: Kaitlin Levy MRN [id number]  Date of birth: 2001/01/22 Chief Complaint:   bump on vagina (STD screening)  History of Present Illness:   Kaitlin Levy is a 21 y.o. G57P0010  female being seen today for the following concerns:  -Cyst: At first thought it was ingrown hair- tried warm compress and to 'break' it but was not able to do it.  This started several weeks ago.  Then with cycle, cyst improved.  Notes some itching and irritation around the area.  Additionally, notes some burning that started 2 days ago.   No vaginal discharge.  Denies pelvic or abdominal pain.  No urinary concerns  Sexually active with same- however recently found out about infidelity.  Pt became tearful in discussing this.  Same partner x 5 yr, reporting feeling like she wasted all this time and now for him to go do this.  She is very concerned that she has an STI.    Patient's last menstrual period was 04/11/2022 (approximate).     07/08/2021   10:59 AM 10/04/2016    4:04 PM 08/24/2016    2:33 PM  Depression screen PHQ 2/9  Decreased Interest 1 0 1  Down, Depressed, Hopeless 1 0 1  PHQ - 2 Score 2 0 2  Altered sleeping 0 1 2  Tired, decreased energy 1 1 2   Change in appetite 0 0 1  Feeling bad or failure about yourself  1 0 1  Trouble concentrating 1 0 2  Moving slowly or fidgety/restless 0 0 0  Suicidal thoughts 0 0 0  PHQ-9 Score 5 2 10      Review of Systems:   Pertinent items are noted in HPI Denies fever/chills, dizziness, headaches, visual disturbances, fatigue, shortness of breath, chest pain, abdominal pain, vomiting, denies problems with periods, bowel movements, urination, or intercourse unless otherwise stated above.  Pertinent History Reviewed:  Reviewed past medical,surgical, social, obstetrical and family history.  Reviewed problem list, medications and allergies. Physical Assessment:   Vitals:   04/23/22 0852  BP: 115/69  Pulse: 79  Weight: 170 lb 6.4 oz  (77.3 kg)  Height: 5\' 2"  (1.575 m)  Body mass index is 31.17 kg/m.       Physical Examination:   General appearance: alert, well appearing, and in no distress  Psych: mood appropriate, normal affect  Skin: warm & dry   Cardiovascular: normal heart rate noted  Respiratory: normal respiratory effort, no distress  Abdomen: soft, non-tender   Pelvic: VULVA: normal appearing vulva with no masses, tenderness or lesions, VAGINA: normal appearing vagina with normal color and discharge, no lesions, CERVIX: normal appearing cervix without discharge or lesions, UTERUS: uterus is normal size, shape, consistency and nontender  Extremities: no edema   Chaperone:    Assessment & Plan:  1) STI screening -lab work both vaginal culture and blood work to be completed -pt requesting HSV testing- reviewed potential results of lab work  2) Anxiety about health -reassured pt that there were no abnormalities noted on exam -HSV genital not completed as her exam was normal -suspect pt have had an enlarged node that has now resolved  Questions and concerns were addressed Plan to f/u as needed or annual in August   Orders Placed This Encounter  Procedures   RPR   HIV Antibody (routine testing w rflx)   HSV 2 antibody, IgG   HSV 1 antibody, IgG    Return if symptoms worsen or fail  to improve, for annual in August.   Myna Hidalgo, DO Attending Obstetrician & Gynecologist, [facility] for [facility], [facility]

## 2022-04-24 LAB — HIV ANTIBODY (ROUTINE TESTING W REFLEX): HIV Screen 4th Generation wRfx: NONREACTIVE

## 2022-04-24 LAB — HSV 2 ANTIBODY, IGG: HSV 2 IgG, Type Spec: <0.91 index (ref 0.00–0.90)

## 2022-04-24 LAB — HSV 1 ANTIBODY, IGG: HSV 1 Glycoprotein G Ab, IgG: <0.91 index (ref 0.00–0.90)

## 2022-04-24 LAB — RPR: RPR Ser Ql: NONREACTIVE

## 2022-04-26 LAB — CERVICOVAGINAL ANCILLARY ONLY
Bacterial Vaginitis (gardnerella): POSITIVE — AB
Candida Glabrata: NEGATIVE
Candida Vaginitis: NEGATIVE
Chlamydia: NEGATIVE
Comment: NEGATIVE
Comment: NEGATIVE
Comment: NEGATIVE
Comment: NEGATIVE
Comment: NEGATIVE
Comment: NORMAL
Neisseria Gonorrhea: NEGATIVE
Trichomonas: NEGATIVE

## 2022-04-27 ENCOUNTER — Other Ambulatory Visit: Payer: Self-pay | Admitting: Obstetrics & Gynecology

## 2022-04-27 DIAGNOSIS — B9689 Other specified bacterial agents as the cause of diseases classified elsewhere: Secondary | ICD-10-CM

## 2022-04-27 MED ORDER — CLINDAMYCIN PHOSPHATE 100 MG VA SUPP: 100.0000 mg | Freq: Every day | VAGINAL | 1 refills | Status: AC

## 2022-04-27 NOTE — Progress Notes (Signed)
Rx sent in for vaginal clindamyn

## 2022-04-28 ENCOUNTER — Telehealth: Payer: Self-pay

## 2022-04-28 DIAGNOSIS — B9689 Other specified bacterial agents as the cause of diseases classified elsewhere: Secondary | ICD-10-CM

## 2022-04-28 MED ORDER — CLINDAMYCIN HCL 300 MG PO CAPS: 300.0000 mg | ORAL_CAPSULE | Freq: Two times a day (BID) | ORAL | 0 refills | Status: AC

## 2022-04-28 NOTE — Telephone Encounter (Signed)
Pt states that the clindamycin is expensive. Wanted to see if there is another option for treating BV. She is allergic to metronidazole.

## 2022-04-28 NOTE — Telephone Encounter (Signed)
Pt called and stated that she was called in a prescription for BV and she can not afford it and wanted to know if it was something cheaper that could be called in.

## 2022-04-28 NOTE — Telephone Encounter (Signed)
Due to cost of medication, plan to try oral clindamycin instead.

## 2022-05-19 ENCOUNTER — Other Ambulatory Visit (INDEPENDENT_AMBULATORY_CARE_PROVIDER_SITE_OTHER): Payer: Self-pay | Admitting: *Deleted

## 2022-05-19 DIAGNOSIS — R300 Dysuria: Secondary | ICD-10-CM

## 2022-05-19 DIAGNOSIS — R109 Unspecified abdominal pain: Secondary | ICD-10-CM

## 2022-05-19 DIAGNOSIS — R309 Painful micturition, unspecified: Secondary | ICD-10-CM

## 2022-05-19 DIAGNOSIS — R350 Frequency of micturition: Secondary | ICD-10-CM

## 2022-05-19 LAB — POCT URINALYSIS DIPSTICK
Blood, UA: NEGATIVE
Glucose, UA: NEGATIVE
Leukocytes, UA: NEGATIVE
Nitrite, UA: NEGATIVE
Protein, UA: NEGATIVE

## 2022-05-19 NOTE — Progress Notes (Signed)
   NURSE VISIT- UTI SYMPTOMS   SUBJECTIVE:  Kaitlin Levy is a 21 y.o. G84P0010 female here for UTI symptoms. She is a GYN patient. She reports  abd pain, urinary frequency, burning and pain with urination X 1 month .  OBJECTIVE:  LMP 04/11/2022 (Approximate)   Appears well, in no apparent distress  Results for orders placed or performed in visit on 05/19/22 (from the past 24 hour(s))  POCT Urinalysis Dipstick   Collection Time: 05/19/22  3:37 PM  Result Value Ref Range   Color, UA     Clarity, UA     Glucose, UA Negative Negative   Bilirubin, UA     Ketones, UA 4+    Spec Grav, UA     Blood, UA neg    pH, UA     Protein, UA Negative Negative   Urobilinogen, UA     Nitrite, UA neg    Leukocytes, UA Negative Negative   Appearance     Odor      ASSESSMENT: GYN patient with UTI symptoms and negative nitrites  PLAN: Note routed to Cyril Mourning, AGNP   Rx sent by provider today: No; advised can try AZO. Urine culture sent Call or return to clinic prn if these symptoms worsen or fail to improve as anticipated. Follow-up: as needed   [name]  05/19/2022 3:49 PM

## 2022-05-20 ENCOUNTER — Other Ambulatory Visit: Payer: Self-pay

## 2022-05-20 ENCOUNTER — Emergency Department (HOSPITAL_COMMUNITY)
Admission: EM | Admit: 2022-05-20 | Discharge: 2022-05-20 | Disposition: A | Discharge status: Home / Self Care | Payer: Self-pay | Attending: Emergency Medicine | Admitting: Emergency Medicine

## 2022-05-20 ENCOUNTER — Encounter (HOSPITAL_COMMUNITY): Payer: Self-pay

## 2022-05-20 DIAGNOSIS — R109 Unspecified abdominal pain: Secondary | ICD-10-CM | POA: Insufficient documentation

## 2022-05-20 DIAGNOSIS — R300 Dysuria: Secondary | ICD-10-CM | POA: Insufficient documentation

## 2022-05-20 LAB — WET PREP, GENITAL
Clue Cells Wet Prep HPF POC: NONE SEEN
Sperm: NONE SEEN
Trich, Wet Prep: NONE SEEN
WBC, Wet Prep HPF POC: <10 (ref <10)
Yeast Wet Prep HPF POC: NONE SEEN

## 2022-05-20 LAB — URINALYSIS, ROUTINE W REFLEX MICROSCOPIC
Bacteria, UA: NONE SEEN
Bilirubin Urine: NEGATIVE
Glucose, UA: NEGATIVE mg/dL
Ketones, ur: NEGATIVE mg/dL
Leukocytes,Ua: NEGATIVE
Nitrite: NEGATIVE
Protein, ur: NEGATIVE mg/dL
Specific Gravity, Urine: 1.003 — ABNORMAL LOW (ref 1.005–1.030)
pH: 7 (ref 5.0–8.0)

## 2022-05-20 LAB — PREGNANCY, URINE: Preg Test, Ur: NEGATIVE

## 2022-05-20 MED ORDER — PHENAZOPYRIDINE HCL 200 MG PO TABS: 200.0000 mg | ORAL_TABLET | Freq: Three times a day (TID) | ORAL | 0 refills | Status: DC

## 2022-05-20 NOTE — ED Triage Notes (Signed)
Pt presents with urinary frequency, pain with urination, and malodorous urine x 4-5 days.

## 2022-05-20 NOTE — ED Provider Notes (Signed)
[facility] EMERGENCY DEPARTMENT Provider Note   CSN: [id number] Arrival date & time: 05/20/22  1509     History  Chief Complaint  Patient presents with   Urinary Frequency    Kaitlin Levy is a 21 y.o. female.   Urinary Frequency Associated symptoms include abdominal pain. Pertinent negatives include no chest pain, no headaches and no shortness of breath.        Kaitlin Levy is a 21 y.o. female who presents to the Emergency Department complaining of dysuria symptoms, onset 1 month ago worse x4 to 5 days.  She was seen and evaluated 1 month ago by OB/GYN diagnosed with bacterial vaginosis and treated with metronidazole.  She continues to have burning with urination and states her urine appears dark and has a slight odor.  Subjective fever at home, denies flank pain nausea or vomiting.  Describes having pressure to her lower abdomen.  Denies any abnormal vaginal discharge, or abnormal vaginal bleeding.  Had urinalysis performed at OB/GYN office yesterday but unaware of results.  Came here for further evaluation.   Home Medications Prior to Admission medications   Medication Sig Start Date End Date Taking? Authorizing Provider  ibuprofen (ADVIL,MOTRIN) 600 MG tablet Take 1 tablet (600 mg total) by mouth every 6 (six) hours as needed. 12/06/17   Elson Areas, PA-C  triamcinolone cream (KENALOG) 0.5 % APPLY TO AFFECTED AREAS AS NEEDED 03/16/17   [provider]      Allergies    Metronidazole and Lactose intolerance (gi)    Review of Systems   Review of Systems  Constitutional:  Positive for fever. Negative for appetite change and chills.  Respiratory:  Negative for cough, shortness of breath and wheezing.   Cardiovascular:  Negative for chest pain.  Gastrointestinal:  Positive for abdominal pain and nausea. Negative for vomiting.  Genitourinary:  Positive for dysuria and frequency. Negative for difficulty urinating, flank pain, pelvic pain, vaginal bleeding,  vaginal discharge and vaginal pain.  Musculoskeletal:  Negative for back pain.  Neurological:  Negative for dizziness, weakness and headaches.    Physical Exam Updated Vital Signs BP 119/80 (BP Location: Right Arm)   Pulse 76   Temp 98 F (36.7 C) (Oral)   Resp 17   Ht 5\' 2"  (1.575 m)   Wt 76.2 kg   LMP 05/06/2022 (Approximate)   SpO2 100%   BMI 30.73 kg/m  Physical Exam Vitals and nursing note reviewed. Exam conducted with a chaperone present.  Constitutional:      General: She is not in acute distress.    Appearance: Normal appearance. She is not ill-appearing or toxic-appearing.  Cardiovascular:     Rate and Rhythm: Normal rate and regular rhythm.     Pulses: Normal pulses.  Pulmonary:     Effort: Pulmonary effort is normal.  Abdominal:     General: There is no distension.     Palpations: Abdomen is soft.     Tenderness: There is no abdominal tenderness. There is no right CVA tenderness or left CVA tenderness.  Genitourinary:    Labia:        Right: No rash, tenderness or lesion.        Left: No rash, tenderness or lesion.      Vagina: No foreign body. No vaginal discharge or bleeding.     Cervix: No cervical motion tenderness, erythema or cervical bleeding.     Uterus: Not enlarged and not tender.      Adnexa:  Right: No mass or tenderness.         Left: No mass or tenderness.    Musculoskeletal:        General: Normal range of motion.  Skin:    General: Skin is warm.     Capillary Refill: Capillary refill takes less than 2 seconds.     Findings: No rash.  Neurological:     General: No focal deficit present.     Mental Status: She is alert.     Sensory: No sensory deficit.     Motor: No weakness.     ED Results / Procedures / Treatments   Labs (all labs ordered are listed, but only abnormal results are displayed) Labs Reviewed  URINALYSIS, ROUTINE W REFLEX MICROSCOPIC - Abnormal; Notable for the following components:      Result Value   Color,  Urine STRAW (*)    Specific Gravity, Urine 1.003 (*)    Hgb urine dipstick SMALL (*)    All other components within normal limits  PREGNANCY, URINE    EKG None  Radiology No results found.  Procedures Procedures    Medications Ordered in ED Medications - No data to display  ED Course/ Medical Decision Making/ A&P                           Medical Decision Making Patient here for evaluation of dysuria, symptoms x1 month worse 4-5 days.  Subjective fever at home, no nausea vomiting or flank pain.  She denies any abnormal vaginal bleeding or discharge.  Was seen by OB/GYN and had a urinalysis yesterday but does not know the results.  On exam, patient well-appearing nontoxic.  Vital signs reassuring.  No CVA tenderness on my exam.  She does have slight discomfort to palpation over the suprapubic region.  Remaining abdominal exam is benign. Symptoms possibly related to UTI.  Differential would also include pyelonephritis, STI, gynecologic disorder.  Amount and/or Complexity of Data Reviewed Labs: ordered.  Risk Prescription drug management.   Pt with reassuring work up.  U/A w/o evidence of cystitis.  Pelvic exam unremarkable.  STI testing pending.  Doubt emergent process.   Pt to f/u with GYN.           Final Clinical Impression(s) / ED Diagnoses Final diagnoses:  Dysuria    Rx / DC Orders ED Discharge Orders     None         Pauline Aus, PA-C 05/22/22 2059    Mancel Bale, MD 05/24/22 [phone]

## 2022-05-20 NOTE — Discharge Instructions (Signed)
Drink plenty of water.  Take the medication as directed until finished.  This medicine will turn your urine orange color but this will resolve once the medication is stopped.  Please contact [facility] for further evaluation.  You may review your pending results in MyChart in 2 to 3 days.

## 2022-05-21 LAB — URINALYSIS, ROUTINE W REFLEX MICROSCOPIC
Bilirubin, UA: NEGATIVE
Glucose, UA: NEGATIVE
Leukocytes,UA: NEGATIVE
Nitrite, UA: NEGATIVE
RBC, UA: NEGATIVE
Specific Gravity, UA: 1.023 (ref 1.005–1.030)
Urobilinogen, Ur: 0.2 mg/dL (ref 0.2–1.0)
pH, UA: 6 (ref 5.0–7.5)

## 2022-05-21 LAB — GC/CHLAMYDIA PROBE AMP (~~LOC~~) NOT AT ARMC
Chlamydia: NEGATIVE
Comment: NEGATIVE
Comment: NORMAL
Neisseria Gonorrhea: NEGATIVE

## 2022-05-22 LAB — URINE CULTURE

## 2022-06-02 ENCOUNTER — Encounter: Payer: Self-pay | Admitting: Obstetrics & Gynecology

## 2022-06-03 ENCOUNTER — Telehealth: Payer: Self-pay

## 2022-06-03 NOTE — Telephone Encounter (Signed)
I spoke to patient and she stated that she didn't want to make appointment due to having Family Planning and unable to pay and patient hung up

## 2022-08-16 ENCOUNTER — Other Ambulatory Visit (HOSPITAL_COMMUNITY)
Admission: RE | Admit: 2022-08-16 | Discharge: 2022-08-16 | Disposition: A | Discharge status: Home / Self Care | Payer: Medicaid Other | Source: Ambulatory Visit | Attending: Obstetrics & Gynecology | Admitting: Obstetrics & Gynecology

## 2022-08-16 ENCOUNTER — Other Ambulatory Visit (INDEPENDENT_AMBULATORY_CARE_PROVIDER_SITE_OTHER): Payer: Self-pay | Admitting: *Deleted

## 2022-08-16 DIAGNOSIS — Z113 Encounter for screening for infections with a predominantly sexual mode of transmission: Secondary | ICD-10-CM | POA: Diagnosis present

## 2022-08-16 NOTE — Progress Notes (Signed)
   NURSE VISIT- VAGINITIS/STD/POC  SUBJECTIVE:  Kaitlin Levy is a 21 y.o. G1P0010 GYN patientfemale here for a vaginal swab for STD screen.  She reports the following symptoms: none for 0 days. Denies abnormal vaginal bleeding, significant pelvic pain, fever, or UTI symptoms.  OBJECTIVE:  There were no vitals taken for this visit.  Appears well, in no apparent distress  ASSESSMENT: Vaginal swab for STD screen  PLAN: Self-collected vaginal probe for Gonorrhea, Chlamydia, Trichomonas, Bacterial Vaginosis, Yeast sent to lab. Also ordered labs for hiv, hbsag, hsv1&2, rpr per patient request.  Treatment: to be determined once results are received Follow-up as needed if symptoms persist/worsen, or new symptoms develop  [name]  08/16/2022 3:24 PM

## 2022-08-17 LAB — HIV ANTIBODY (ROUTINE TESTING W REFLEX): HIV Screen 4th Generation wRfx: NONREACTIVE

## 2022-08-17 LAB — HEPATITIS B SURFACE ANTIGEN: Hepatitis B Surface Ag: NEGATIVE

## 2022-08-17 LAB — HSV 1 ANTIBODY, IGG: HSV 1 Glycoprotein G Ab, IgG: <0.91 index (ref 0.00–0.90)

## 2022-08-17 LAB — RPR: RPR Ser Ql: NONREACTIVE

## 2022-08-17 LAB — HSV-2 AB, IGG: HSV 2 IgG, Type Spec: <0.91 index (ref 0.00–0.90)

## 2022-08-18 ENCOUNTER — Other Ambulatory Visit: Payer: Self-pay | Admitting: Adult Health

## 2022-08-18 LAB — CERVICOVAGINAL ANCILLARY ONLY
Bacterial Vaginitis (gardnerella): NEGATIVE
Candida Glabrata: NEGATIVE
Candida Vaginitis: POSITIVE — AB
Chlamydia: NEGATIVE
Comment: NEGATIVE
Comment: NEGATIVE
Comment: NEGATIVE
Comment: NEGATIVE
Comment: NEGATIVE
Comment: NORMAL
Neisseria Gonorrhea: NEGATIVE
Trichomonas: NEGATIVE

## 2022-08-23 ENCOUNTER — Other Ambulatory Visit: Payer: Self-pay

## 2022-08-23 ENCOUNTER — Encounter (HOSPITAL_COMMUNITY): Payer: Self-pay | Admitting: Emergency Medicine

## 2022-08-23 ENCOUNTER — Emergency Department (HOSPITAL_COMMUNITY)
Admission: EM | Admit: 2022-08-23 | Discharge: 2022-08-24 | Disposition: A | Discharge status: Home / Self Care | Payer: No Typology Code available for payment source | Attending: Emergency Medicine | Admitting: Emergency Medicine

## 2022-08-23 ENCOUNTER — Emergency Department (HOSPITAL_COMMUNITY): Payer: No Typology Code available for payment source

## 2022-08-23 DIAGNOSIS — Y92410 Unspecified street and highway as the place of occurrence of the external cause: Secondary | ICD-10-CM | POA: Diagnosis not present

## 2022-08-23 DIAGNOSIS — M545 Low back pain, unspecified: Secondary | ICD-10-CM | POA: Diagnosis not present

## 2022-08-23 DIAGNOSIS — M25511 Pain in right shoulder: Secondary | ICD-10-CM | POA: Diagnosis present

## 2022-08-23 DIAGNOSIS — S39012A Strain of muscle, fascia and tendon of lower back, initial encounter: Secondary | ICD-10-CM

## 2022-08-23 LAB — PREGNANCY, URINE: Preg Test, Ur: NEGATIVE

## 2022-08-23 MED ORDER — ACETAMINOPHEN 325 MG PO TABS
650.0000 mg | ORAL_TABLET | Freq: Once | ORAL | Status: AC
  Administered 2022-08-23: 650 mg via ORAL

## 2022-08-23 MED ORDER — ACETAMINOPHEN 325 MG PO TABS
ORAL_TABLET | ORAL | Status: AC
  Filled 2022-08-23: qty 2

## 2022-08-23 NOTE — ED Notes (Signed)
Pt becoming verbally aggressive ; yelling at nursing staff, upset about wait times.

## 2022-08-23 NOTE — ED Notes (Signed)
Patient is now in xray 
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
## 2022-08-23 NOTE — ED Notes (Signed)
Patient transported to X-ray 

## 2022-08-23 NOTE — ED Notes (Signed)
Patient is alert and oriented.  Reports her pain has decreased to a 5 or 6.  She is inquiring on the xray for her neck.  I will message the provider

## 2022-08-24 NOTE — ED Notes (Signed)
Md at bedside

## 2022-08-24 NOTE — Discharge Instructions (Signed)
Begin taking ibuprofen 600 mg every 6 hours as needed for pain.  Ice for 20 minutes every 2 hours while awake for the next 2 days.  Follow-up with primary doctor if not improving in the next few days.

## 2022-08-24 NOTE — ED Notes (Signed)
Patient verbalized understanding of discharge instructions and reasons to return to the ED 
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
## 2022-12-08 ENCOUNTER — Other Ambulatory Visit (INDEPENDENT_AMBULATORY_CARE_PROVIDER_SITE_OTHER): Payer: Self-pay

## 2022-12-08 ENCOUNTER — Other Ambulatory Visit (HOSPITAL_COMMUNITY)
Admission: RE | Admit: 2022-12-08 | Discharge: 2022-12-08 | Disposition: A | Discharge status: Home / Self Care | Payer: Medicaid Other | Source: Ambulatory Visit | Attending: Obstetrics & Gynecology | Admitting: Obstetrics & Gynecology

## 2022-12-08 DIAGNOSIS — M545 Low back pain, unspecified: Secondary | ICD-10-CM | POA: Diagnosis present

## 2022-12-08 DIAGNOSIS — N898 Other specified noninflammatory disorders of vagina: Secondary | ICD-10-CM

## 2022-12-08 LAB — POCT URINALYSIS DIPSTICK OB
Blood, UA: NEGATIVE
Glucose, UA: NEGATIVE
Leukocytes, UA: NEGATIVE
Nitrite, UA: NEGATIVE
POC,PROTEIN,UA: NEGATIVE

## 2022-12-08 NOTE — Progress Notes (Signed)
   NURSE VISIT- VAGINITIS  SUBJECTIVE:  Kaitlin Levy is a 22 y.o. G1P0010 GYN patientfemale here for a vaginal swab for vaginitis screening.  She reports the following symptoms: discharge described as yellow, local irritation, odor, urinary symptoms of flank pain bilaterally, and cramping  for 4 days Denies abnormal vaginal bleeding, significant pelvic pain, fever, or UTI symptoms.  OBJECTIVE:  There were no vitals taken for this visit.  Appears well, in no apparent distress  ASSESSMENT: Vaginal swab for vaginitis screening UA/Culture  PLAN: Self-collected vaginal probe for Gonorrhea, Chlamydia, Trichomonas, Bacterial Vaginosis, Yeast sent to lab Urine culture/urinalysis sent Treatment: to be determined once results are received Follow-up as needed if symptoms persist/worsen, or new symptoms develop  Kaitlin Levy  12/08/2022 3:25 PM

## 2022-12-09 LAB — URINALYSIS, ROUTINE W REFLEX MICROSCOPIC
Bilirubin, UA: NEGATIVE
Glucose, UA: NEGATIVE
Leukocytes,UA: NEGATIVE
Nitrite, UA: NEGATIVE
Protein,UA: NEGATIVE
RBC, UA: NEGATIVE
Specific Gravity, UA: 1.02 (ref 1.005–1.030)
Urobilinogen, Ur: 0.2 mg/dL (ref 0.2–1.0)
pH, UA: 6 (ref 5.0–7.5)

## 2022-12-10 LAB — CERVICOVAGINAL ANCILLARY ONLY
Bacterial Vaginitis (gardnerella): POSITIVE — AB
Candida Glabrata: NEGATIVE
Candida Vaginitis: NEGATIVE
Chlamydia: NEGATIVE
Comment: NEGATIVE
Comment: NEGATIVE
Comment: NEGATIVE
Comment: NEGATIVE
Comment: NEGATIVE
Comment: NORMAL
Neisseria Gonorrhea: NEGATIVE
Trichomonas: NEGATIVE

## 2022-12-10 LAB — URINE CULTURE

## 2022-12-13 ENCOUNTER — Encounter: Payer: Self-pay | Admitting: Obstetrics & Gynecology

## 2022-12-13 ENCOUNTER — Other Ambulatory Visit: Payer: Self-pay | Admitting: Adult Health

## 2022-12-13 MED ORDER — METRONIDAZOLE 0.75 % VA GEL: 1.0000 | Freq: Every day | VAGINAL | 0 refills | Status: DC

## 2022-12-13 NOTE — Progress Notes (Signed)
Rx metrogel  

## 2022-12-14 ENCOUNTER — Ambulatory Visit (INDEPENDENT_AMBULATORY_CARE_PROVIDER_SITE_OTHER): Payer: Self-pay | Admitting: *Deleted

## 2022-12-14 VITALS — BP 114/63 | HR 83 | Wt 177.0 lb

## 2022-12-14 DIAGNOSIS — Z3201 Encounter for pregnancy test, result positive: Secondary | ICD-10-CM

## 2022-12-14 DIAGNOSIS — N926 Irregular menstruation, unspecified: Secondary | ICD-10-CM

## 2022-12-14 LAB — POCT URINE PREGNANCY: Preg Test, Ur: POSITIVE — AB

## 2022-12-14 NOTE — Progress Notes (Signed)
   NURSE VISIT- PREGNANCY CONFIRMATION   SUBJECTIVE:  Kaitlin Levy is a 22 y.o. G2P0010 female at [redacted]w[redacted]d by certain LMP of Patient's last menstrual period was 11/10/2022 (approximate). Here for pregnancy confirmation.  Home pregnancy test: positive x 3   She reports cramping and nausea.  She is not taking prenatal vitamins.  Plans to terminate the pregnancy.  OBJECTIVE:  BP 114/63 (BP Location: Right Arm, Patient Position: Sitting, Cuff Size: Normal)   Pulse 83   Wt 177 lb (80.3 kg)   LMP 11/10/2022 (Approximate)   BMI 33.44 kg/m   Appears well, in no apparent distress  Results for orders placed or performed in visit on 12/14/22 (from the past 24 hour(s))  POCT urine pregnancy   Collection Time: 12/14/22  3:44 PM  Result Value Ref Range   Preg Test, Ur Positive (A) Negative    ASSESSMENT: Positive pregnancy test, [redacted]w[redacted]d by LMP   Plans to terminate pregnancy  PLAN: Call office if anything changes regarding pregnancy Prenatal vitamins:  does not plan on starting    Nausea medicines: not currently needed   OB packet given: No  [name]  12/14/2022 3:44 PM

## 2022-12-23 ENCOUNTER — Telehealth: Payer: Self-pay

## 2022-12-23 MED ORDER — PROMETHAZINE HCL 25 MG PO TABS: 25.0000 mg | ORAL_TABLET | Freq: Four times a day (QID) | ORAL | 1 refills | Status: DC | PRN

## 2022-12-23 NOTE — Telephone Encounter (Signed)
Rx phenergan 

## 2022-12-23 NOTE — Telephone Encounter (Signed)
Patient would like something called in for nausea. When something is called in can you call her or send her a Mychart message.

## 2022-12-23 NOTE — Telephone Encounter (Signed)
Patient called requesting nausea medication.  States she is scheduled to have an abortion in the next couple of weeks but is needing something now if possible.  Uses [facility] in [place]. Advised to check back with her pharmacy later this evening.

## 2023-01-14 ENCOUNTER — Other Ambulatory Visit: Payer: Self-pay

## 2023-01-14 ENCOUNTER — Encounter (HOSPITAL_COMMUNITY): Payer: Self-pay

## 2023-01-14 ENCOUNTER — Emergency Department (HOSPITAL_COMMUNITY)
Admission: EM | Admit: 2023-01-14 | Discharge: 2023-01-14 | Disposition: A | Discharge status: Home / Self Care | Payer: BC Managed Care – PPO | Attending: Emergency Medicine | Admitting: Emergency Medicine

## 2023-01-14 DIAGNOSIS — N898 Other specified noninflammatory disorders of vagina: Secondary | ICD-10-CM

## 2023-01-14 DIAGNOSIS — R300 Dysuria: Secondary | ICD-10-CM

## 2023-01-14 LAB — URINALYSIS, ROUTINE W REFLEX MICROSCOPIC
Bilirubin Urine: NEGATIVE
Glucose, UA: NEGATIVE mg/dL
Ketones, ur: NEGATIVE mg/dL
Nitrite: NEGATIVE
Protein, ur: NEGATIVE mg/dL
RBC / HPF: >50 RBC/hpf (ref 0–5)
Specific Gravity, Urine: 1.006 (ref 1.005–1.030)
pH: 7 (ref 5.0–8.0)

## 2023-01-14 LAB — CBC WITH DIFFERENTIAL/PLATELET
Abs Immature Granulocytes: 0.06 10*3/uL (ref 0.00–0.07)
Basophils Absolute: 0.1 10*3/uL (ref 0.0–0.1)
Basophils Relative: 1 %
Eosinophils Absolute: 0.1 10*3/uL (ref 0.0–0.5)
Eosinophils Relative: 1 %
HCT: 40 % (ref 36.0–46.0)
Hemoglobin: 13 g/dL (ref 12.0–15.0)
Immature Granulocytes: 1 %
Lymphocytes Relative: 21 %
Lymphs Abs: 2.2 10*3/uL (ref 0.7–4.0)
MCH: 25.3 pg — ABNORMAL LOW (ref 26.0–34.0)
MCHC: 32.5 g/dL (ref 30.0–36.0)
MCV: 78 fL — ABNORMAL LOW (ref 80.0–100.0)
Monocytes Absolute: 0.9 10*3/uL (ref 0.1–1.0)
Monocytes Relative: 8 %
Neutro Abs: 7.1 10*3/uL (ref 1.7–7.7)
Neutrophils Relative %: 68 %
Platelets: 371 10*3/uL (ref 150–400)
RBC: 5.13 MIL/uL — ABNORMAL HIGH (ref 3.87–5.11)
RDW: 14 % (ref 11.5–15.5)
WBC: 10.4 10*3/uL (ref 4.0–10.5)
nRBC: 0 % (ref 0.0–0.2)

## 2023-01-14 LAB — COMPREHENSIVE METABOLIC PANEL
ALT: 51 U/L — ABNORMAL HIGH (ref 0–44)
AST: 40 U/L (ref 15–41)
Albumin: 4 g/dL (ref 3.5–5.0)
Alkaline Phosphatase: 53 U/L (ref 38–126)
Anion gap: 10 (ref 5–15)
BUN: 8 mg/dL (ref 6–20)
CO2: 24 mmol/L (ref 22–32)
Calcium: 9.4 mg/dL (ref 8.9–10.3)
Chloride: 101 mmol/L (ref 98–111)
Creatinine, Ser: 0.65 mg/dL (ref 0.44–1.00)
GFR, Estimated: >60 mL/min (ref >60)
Glucose, Bld: 86 mg/dL (ref 70–99)
Potassium: 3.9 mmol/L (ref 3.5–5.1)
Sodium: 135 mmol/L (ref 135–145)
Total Bilirubin: 0.3 mg/dL (ref 0.3–1.2)
Total Protein: 7.8 g/dL (ref 6.5–8.1)

## 2023-01-14 LAB — WET PREP, GENITAL
Clue Cells Wet Prep HPF POC: NONE SEEN
Sperm: NONE SEEN
Trich, Wet Prep: NONE SEEN
WBC, Wet Prep HPF POC: <10 (ref <10)
Yeast Wet Prep HPF POC: NONE SEEN

## 2023-01-14 MED ORDER — CEPHALEXIN 500 MG PO CAPS: 500.0000 mg | ORAL_CAPSULE | Freq: Four times a day (QID) | ORAL | 0 refills | Status: DC

## 2023-01-14 NOTE — ED Provider Notes (Signed)
[facility] Provider Note   CSN: [id number] Arrival date & time: 01/14/23  1301     History {Add pertinent medical, surgical, social history, OB history to HPI:1} No chief complaint on file.   Kaitlin Levy is a 21 y.o. female.  HPI     Home Medications Prior to Admission medications   Medication Sig Start Date End Date Taking? Authorizing Provider  cephALEXin (KEFLEX) 500 MG capsule Take 1 capsule (500 mg total) by mouth 4 (four) times daily. 01/14/23  Yes Bhavana Kady, PA-C  ibuprofen (ADVIL,MOTRIN) 600 MG tablet Take 1 tablet (600 mg total) by mouth every 6 (six) hours as needed. Patient not taking: Reported on 12/14/2022 12/06/17   Fransico Meadow, PA-C  metroNIDAZOLE (METROGEL) 0.75 % vaginal gel Place 1 Applicatorful vaginally at bedtime. 12/13/22   Estill Dooms, NP  phenazopyridine (PYRIDIUM) 200 MG tablet Take 1 tablet (200 mg total) by mouth 3 (three) times daily. 05/20/22   Trimaine Maser, PA-C  promethazine (PHENERGAN) 25 MG tablet Take 1 tablet (25 mg total) by mouth every 6 (six) hours as needed for nausea or vomiting. 12/23/22   Estill Dooms, NP  triamcinolone cream (KENALOG) 0.5 % APPLY TO AFFECTED AREAS AS NEEDED 03/16/17   [provider]      Allergies    Metronidazole and Lactose intolerance (gi)    Review of Systems   Review of Systems  Physical Exam Updated Vital Signs BP 112/88 (BP Location: Right Arm)   Pulse 85   Temp 98 F (36.7 C) (Oral)   Resp 18   Ht '5\' 1"'$  (1.549 m)   Wt 79.4 kg   LMP 01/14/2023 (Exact Date)   SpO2 100%   Breastfeeding Unknown   BMI 33.07 kg/m  Physical Exam  ED Results / Procedures / Treatments   Labs (all labs ordered are listed, but only abnormal results are displayed) Labs Reviewed  CBC WITH DIFFERENTIAL/PLATELET - Abnormal; Notable for the following components:      Result Value   RBC 5.13 (*)    MCV 78.0 (*)    MCH 25.3 (*)    All other  components within normal limits  COMPREHENSIVE METABOLIC PANEL - Abnormal; Notable for the following components:   ALT 51 (*)    All other components within normal limits  URINALYSIS, ROUTINE W REFLEX MICROSCOPIC - Abnormal; Notable for the following components:   Hgb urine dipstick LARGE (*)    Leukocytes,Ua SMALL (*)    Bacteria, UA RARE (*)    All other components within normal limits  WET PREP, GENITAL  URINE CULTURE  GC/CHLAMYDIA PROBE AMP ([place]) NOT AT [facility]    EKG None  Radiology No results found.  Procedures Procedures  {Document cardiac monitor, telemetry assessment procedure when appropriate:1}  Medications Ordered in ED Medications - No data to display  ED Course/ Medical Decision Making/ A&P   {   Click here for ABCD2, HEART and other calculatorsREFRESH Note before signing :1}                          Medical Decision Making Amount and/or Complexity of Data Reviewed Labs: ordered.  Risk Prescription drug management.   ***  {Document critical care time when appropriate:1} {Document review of labs and clinical decision tools ie heart score, Chads2Vasc2 etc:1}  {Document your independent review of radiology images, and any outside records:1} {Document your discussion with family  members, caretakers, and with consultants:1} {Document social determinants of health affecting pt's care:1} {Document your decision making why or why not admission, treatments were needed:1} Final Clinical Impression(s) / ED Diagnoses Final diagnoses:  Dysuria  Vaginal odor    Rx / DC Orders ED Discharge Orders          Ordered    cephALEXin (KEFLEX) 500 MG capsule  4 times daily        01/14/23 1735

## 2023-01-14 NOTE — ED Notes (Signed)
Pt reminded of need for urine sample.  

## 2023-01-14 NOTE — ED Triage Notes (Signed)
States she had an abortion on 01/08/23.  Pt states she has been passing tissue normally but since yesterday she has noticed a foul odor from her vagina with lower, middle ABD pain. States she has taken multiple showers, states it doesn't smell like BV.

## 2023-01-14 NOTE — Discharge Instructions (Signed)
Please take the antibiotic as directed until finished.  Drink plenty of water.  You may also take over-the-counter ibuprofen, 600 mg 3 times a day with food if needed for pain.  Heating pad applied to your lower abdomen may help as well.  Please [facility] your [facility].  Return to the emergency department for any new or worsening symptoms.

## 2023-01-16 LAB — URINE CULTURE: Culture: NO GROWTH

## 2023-01-17 ENCOUNTER — Ambulatory Visit: Payer: BC Managed Care – PPO | Admitting: Obstetrics and Gynecology

## 2023-01-17 LAB — GC/CHLAMYDIA PROBE AMP (~~LOC~~) NOT AT ARMC
Chlamydia: NEGATIVE
Comment: NEGATIVE
Comment: NORMAL
Neisseria Gonorrhea: NEGATIVE

## 2023-01-21 ENCOUNTER — Ambulatory Visit: Payer: BC Managed Care – PPO | Admitting: Adult Health

## 2023-02-03 ENCOUNTER — Ambulatory Visit: Payer: BC Managed Care – PPO | Admitting: Adult Health

## 2023-03-01 ENCOUNTER — Other Ambulatory Visit (HOSPITAL_COMMUNITY)
Admission: RE | Admit: 2023-03-01 | Discharge: 2023-03-01 | Disposition: A | Discharge status: Home / Self Care | Payer: BC Managed Care – PPO | Source: Ambulatory Visit | Attending: Obstetrics & Gynecology | Admitting: Obstetrics & Gynecology

## 2023-03-01 ENCOUNTER — Ambulatory Visit (INDEPENDENT_AMBULATORY_CARE_PROVIDER_SITE_OTHER): Payer: BC Managed Care – PPO | Admitting: *Deleted

## 2023-03-01 DIAGNOSIS — N898 Other specified noninflammatory disorders of vagina: Secondary | ICD-10-CM

## 2023-03-01 DIAGNOSIS — Z113 Encounter for screening for infections with a predominantly sexual mode of transmission: Secondary | ICD-10-CM | POA: Insufficient documentation

## 2023-03-01 NOTE — Progress Notes (Signed)
   NURSE VISIT- VAGINITIS/STD  SUBJECTIVE:  Kaitlin Levy is a 22 y.o. G2P0010 GYN patientfemale here for a vaginal swab for vaginitis screening, STD screen.  She reports the following symptoms:vaginal odor and local irritation for several days. Recently changed laundry detergent.  Denies abnormal vaginal bleeding, significant pelvic pain, fever, or UTI symptoms.  OBJECTIVE:  There were no vitals taken for this visit.  Appears well, in no apparent distress  ASSESSMENT: Vaginal swab for vaginitis screening/STD screen  PLAN: Self-collected vaginal probe for Gonorrhea, Chlamydia, Trichomonas, Bacterial Vaginosis, Yeast sent to lab Treatment: to be determined once results are received Follow-up as needed if symptoms persist/worsen, or new symptoms develop  Jobe Marker  03/01/2023 4:09 PM

## 2023-03-03 LAB — CERVICOVAGINAL ANCILLARY ONLY
Bacterial Vaginitis (gardnerella): NEGATIVE
Candida Glabrata: NEGATIVE
Candida Vaginitis: NEGATIVE
Chlamydia: NEGATIVE
Comment: NEGATIVE
Comment: NEGATIVE
Comment: NEGATIVE
Comment: NEGATIVE
Comment: NEGATIVE
Comment: NORMAL
Neisseria Gonorrhea: NEGATIVE
Trichomonas: NEGATIVE

## 2023-03-16 ENCOUNTER — Encounter: Payer: Self-pay | Admitting: Adult Health

## 2023-03-16 ENCOUNTER — Other Ambulatory Visit (HOSPITAL_COMMUNITY)
Admission: RE | Admit: 2023-03-16 | Discharge: 2023-03-16 | Disposition: A | Discharge status: Home / Self Care | Payer: BC Managed Care – PPO | Source: Ambulatory Visit | Attending: Adult Health | Admitting: Adult Health

## 2023-03-16 ENCOUNTER — Ambulatory Visit (INDEPENDENT_AMBULATORY_CARE_PROVIDER_SITE_OTHER): Payer: BC Managed Care – PPO | Admitting: Adult Health

## 2023-03-16 VITALS — BP 113/64 | HR 84 | Ht 61.0 in | Wt 180.0 lb

## 2023-03-16 DIAGNOSIS — Z3009 Encounter for other general counseling and advice on contraception: Secondary | ICD-10-CM | POA: Diagnosis present

## 2023-03-16 DIAGNOSIS — Z01419 Encounter for gynecological examination (general) (routine) without abnormal findings: Secondary | ICD-10-CM | POA: Diagnosis not present

## 2023-03-16 DIAGNOSIS — Z113 Encounter for screening for infections with a predominantly sexual mode of transmission: Secondary | ICD-10-CM | POA: Insufficient documentation

## 2023-03-16 DIAGNOSIS — Z30011 Encounter for initial prescription of contraceptive pills: Secondary | ICD-10-CM

## 2023-03-16 MED ORDER — NORETHIN ACE-ETH ESTRAD-FE 1-20 MG-MCG PO TABS: 1.0000 | ORAL_TABLET | Freq: Every day | ORAL | 3 refills | Status: DC

## 2023-03-16 NOTE — Progress Notes (Signed)
Patient ID: Kaitlin Levy, female   DOB: 11-Apr-2001, 22 y.o.   MRN: [id number] History of Present Illness: Kaitlin Levy is a 22 year old black female,single, G2P0020, in for a well woman gyn exam. And she wants to get on birth control. She had elective ab 01/08/23.   Current Medications, Allergies, Past Medical History, Past Surgical History, Family History and Social History were reviewed in [employer] record.     Review of Systems: Patient denies any headaches, hearing loss, fatigue, blurred vision, shortness of breath, chest pain, abdominal pain, problems with bowel movements, urination, or intercourse. No joint pain or mood swings.     Physical Exam:BP 113/64 (BP Location: Left Arm, Patient Position: Sitting, Cuff Size: Normal)   Pulse 84   Ht 5\' 1"  (1.549 m)   Wt 180 lb (81.6 kg)   LMP 02/23/2023 (Approximate)   Breastfeeding No   BMI 34.01 kg/m   General:  Well developed, well nourished, no acute distress Skin:  Warm and dry Neck:  Midline trachea, normal thyroid, good ROM, no lymphadenopathy Lungs; Clear to auscultation bilaterally Breast:  No dominant palpable mass, retraction, or nipple discharge Cardiovascular: Regular rate and rhythm Abdomen:  Soft, non tender, no hepatosplenomegaly Pelvic:  External genitalia is normal in appearance, no lesions.  The vagina is normal in appearance. Urethra has no lesions or masses. The cervix is smooth. Uterus is felt to be normal size, shape, and contour.  No adnexal masses or tenderness noted.Bladder is non tender, no masses felt. CV swab obtained. Extremities/musculoskeletal:  No swelling or varicosities noted, no clubbing or cyanosis Psych:  No mood changes, alert and cooperative,seems happy AA is 2 Fall risk is low    03/16/2023    2:42 PM 07/08/2021   10:59 AM 10/04/2016    4:04 PM  Depression screen PHQ 2/9  Decreased Interest 0 1 0  Down, Depressed, Hopeless 1 1 0  PHQ - 2 Score 1 2 0  Altered sleeping 2 0 1   Tired, decreased energy 3 1 1   Change in appetite 0 0 0  Feeling bad or failure about yourself  0 1 0  Trouble concentrating 0 1 0  Moving slowly or fidgety/restless 2 0 0  Suicidal thoughts 0 0 0  PHQ-9 Score 8 5 2        03/16/2023    2:42 PM 07/08/2021   10:59 AM  GAD 7 : Generalized Anxiety Score  Nervous, Anxious, on Edge 1 0  Control/stop worrying 0 1  Worry too much - different things 1 1  Trouble relaxing 1 1  Restless 1 1  Easily annoyed or irritable 1 1  Afraid - awful might happen 0 1  Total GAD 7 Score 5 6      Upstream - 03/16/23 1447       Pregnancy Intention Screening   Does the patient want to become pregnant in the next year? No    Does the patient's partner want to become pregnant in the next year? No    Would the patient like to discuss contraceptive options today? Yes      Contraception Wrap Up   Current Method Female Condom    End Method Oral Contraceptive;Female Condom    Contraception Counseling Provided Yes    How was the end contraceptive method provided? Prescription            Examination chaperoned by [name] LPN  Impression and Plan: 1. Encounter for well woman exam with  routine gynecological exam Physical and pap in 1 year  2. Screening for STD (sexually transmitted disease) CV swab sent for GC/CHL,trich,BV and yeast - Cervicovaginal ancillary only( [place]) She declines blood labs today  3. Family planning CV swab sent  - Cervicovaginal ancillary only( )  4. Encounter for initial prescription of contraceptive pills Discussed options, and she wants to try the pill She denies MI,stroke,DVT,breast cancer or migraine with aura Will rx Junel 1-20, start with next period and use condoms for at least 1 pack  Meds ordered this encounter  Medications   norethindrone-ethinyl estradiol-FE (LOESTRIN FE) 1-20 MG-MCG tablet    Sig: Take 1 tablet by mouth daily.    Dispense:  84 tablet    Refill:  3    Order Specific  Question:   Supervising Provider    Answer:   Duane Lope H [[id number]]   Follow up in 3 months for ROS and BP check

## 2023-03-18 LAB — CERVICOVAGINAL ANCILLARY ONLY
Bacterial Vaginitis (gardnerella): NEGATIVE
Candida Glabrata: NEGATIVE
Candida Vaginitis: NEGATIVE
Chlamydia: NEGATIVE
Comment: NEGATIVE
Comment: NEGATIVE
Comment: NEGATIVE
Comment: NEGATIVE
Comment: NEGATIVE
Comment: NORMAL
Neisseria Gonorrhea: NEGATIVE
Trichomonas: NEGATIVE

## 2023-04-13 ENCOUNTER — Encounter (HOSPITAL_COMMUNITY): Payer: Self-pay

## 2023-04-13 ENCOUNTER — Other Ambulatory Visit: Payer: Self-pay

## 2023-04-13 ENCOUNTER — Emergency Department (HOSPITAL_COMMUNITY)
Admission: EM | Admit: 2023-04-13 | Discharge: 2023-04-13 | Disposition: A | Discharge status: Home / Self Care | Payer: BC Managed Care – PPO | Attending: Emergency Medicine | Admitting: Emergency Medicine

## 2023-04-13 ENCOUNTER — Emergency Department (HOSPITAL_COMMUNITY): Payer: BC Managed Care – PPO

## 2023-04-13 DIAGNOSIS — S161XXA Strain of muscle, fascia and tendon at neck level, initial encounter: Secondary | ICD-10-CM | POA: Diagnosis not present

## 2023-04-13 DIAGNOSIS — S199XXA Unspecified injury of neck, initial encounter: Secondary | ICD-10-CM | POA: Diagnosis not present

## 2023-04-13 DIAGNOSIS — M549 Dorsalgia, unspecified: Secondary | ICD-10-CM | POA: Insufficient documentation

## 2023-04-13 DIAGNOSIS — W19XXXA Unspecified fall, initial encounter: Secondary | ICD-10-CM

## 2023-04-13 DIAGNOSIS — M542 Cervicalgia: Secondary | ICD-10-CM | POA: Diagnosis not present

## 2023-04-13 DIAGNOSIS — M546 Pain in thoracic spine: Secondary | ICD-10-CM | POA: Diagnosis not present

## 2023-04-13 DIAGNOSIS — Y9355 Activity, bike riding: Secondary | ICD-10-CM | POA: Insufficient documentation

## 2023-04-13 MED ORDER — METHOCARBAMOL 500 MG PO TABS
500.0000 mg | ORAL_TABLET | Freq: Once | ORAL | Status: AC
  Administered 2023-04-13: 500 mg via ORAL
  Filled 2023-04-13: qty 1
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
## 2023-06-16 ENCOUNTER — Ambulatory Visit: Payer: BC Managed Care – PPO | Admitting: Adult Health

## 2023-08-19 ENCOUNTER — Telehealth: Payer: Self-pay

## 2023-08-19 NOTE — Telephone Encounter (Signed)
LVM asking patient to call back.  

## 2023-11-23 ENCOUNTER — Other Ambulatory Visit: Payer: BC Managed Care – PPO

## 2023-11-25 ENCOUNTER — Other Ambulatory Visit (HOSPITAL_COMMUNITY)
Admission: RE | Admit: 2023-11-25 | Discharge: 2023-11-25 | Disposition: A | Discharge status: Home / Self Care | Payer: BC Managed Care – PPO | Source: Ambulatory Visit | Attending: Obstetrics & Gynecology | Admitting: Obstetrics & Gynecology

## 2023-11-25 ENCOUNTER — Other Ambulatory Visit (INDEPENDENT_AMBULATORY_CARE_PROVIDER_SITE_OTHER): Payer: BC Managed Care – PPO

## 2023-11-25 DIAGNOSIS — R300 Dysuria: Secondary | ICD-10-CM | POA: Diagnosis not present

## 2023-11-25 DIAGNOSIS — Z113 Encounter for screening for infections with a predominantly sexual mode of transmission: Secondary | ICD-10-CM

## 2023-11-25 DIAGNOSIS — R3915 Urgency of urination: Secondary | ICD-10-CM

## 2023-11-25 DIAGNOSIS — R1030 Lower abdominal pain, unspecified: Secondary | ICD-10-CM | POA: Insufficient documentation

## 2023-11-25 LAB — POCT URINALYSIS DIPSTICK OB
Blood, UA: NEGATIVE
Glucose, UA: NEGATIVE
Ketones, UA: NEGATIVE
Leukocytes, UA: NEGATIVE
Nitrite, UA: NEGATIVE
POC,PROTEIN,UA: NEGATIVE

## 2023-11-25 NOTE — Progress Notes (Signed)
   NURSE VISIT- UTI SYMPTOMS   SUBJECTIVE:  Kaitlin Levy is a 23 y.o. G67P0020 female here for UTI symptoms. She is a GYN patient. She reports dysuria, lower abdominal pain, urinary urgency, and dark in color . Requesting STD testing as well.  OBJECTIVE:  There were no vitals taken for this visit.  Appears well, in no apparent distress  Results for orders placed or performed in visit on 11/25/23 (from the past 24 hours)  POC Urinalysis Dipstick OB   Collection Time: 11/25/23 11:02 AM  Result Value Ref Range   Color, UA     Clarity, UA     Glucose, UA Negative Negative   Bilirubin, UA     Ketones, UA neg    Spec Grav, UA     Blood, UA neg    pH, UA     POC,PROTEIN,UA Negative Negative, Trace, Small (1+), Moderate (2+), Large (3+), 4+   Urobilinogen, UA     Nitrite, UA neg    Leukocytes, UA Negative Negative   Appearance     Odor      ASSESSMENT: GYN patient with UTI symptoms and negative nitrites  PLAN: Note routed to Delon Lewis, AGNP   Rx sent by provider today: No Urine culture sent Call or return to clinic prn if these symptoms worsen or fail to improve as anticipated. Follow-up: as needed   Shali Vesey  11/25/2023 11:03 AM

## 2023-11-26 LAB — URINALYSIS, ROUTINE W REFLEX MICROSCOPIC
Bilirubin, UA: NEGATIVE
Glucose, UA: NEGATIVE
Ketones, UA: NEGATIVE
Leukocytes,UA: NEGATIVE
Nitrite, UA: NEGATIVE
Protein,UA: NEGATIVE
RBC, UA: NEGATIVE
Specific Gravity, UA: 1.022 (ref 1.005–1.030)
Urobilinogen, Ur: 0.2 mg/dL (ref 0.2–1.0)
pH, UA: 6.5 (ref 5.0–7.5)

## 2023-11-27 LAB — URINE CULTURE: Organism ID, Bacteria: NO GROWTH

## 2023-11-28 LAB — CERVICOVAGINAL ANCILLARY ONLY
Bacterial Vaginitis (gardnerella): NEGATIVE
Candida Glabrata: NEGATIVE
Candida Vaginitis: NEGATIVE
Chlamydia: NEGATIVE
Comment: NEGATIVE
Comment: NEGATIVE
Comment: NEGATIVE
Comment: NEGATIVE
Comment: NEGATIVE
Comment: NORMAL
Neisseria Gonorrhea: NEGATIVE
Trichomonas: NEGATIVE

## 2023-12-13 ENCOUNTER — Other Ambulatory Visit (HOSPITAL_COMMUNITY)
Admission: RE | Admit: 2023-12-13 | Discharge: 2023-12-13 | Disposition: A | Discharge status: Home / Self Care | Payer: BC Managed Care – PPO | Source: Ambulatory Visit | Attending: Obstetrics & Gynecology | Admitting: Obstetrics & Gynecology

## 2023-12-13 ENCOUNTER — Other Ambulatory Visit: Payer: BC Managed Care – PPO | Admitting: *Deleted

## 2023-12-13 ENCOUNTER — Other Ambulatory Visit: Payer: BC Managed Care – PPO

## 2023-12-13 DIAGNOSIS — Z113 Encounter for screening for infections with a predominantly sexual mode of transmission: Secondary | ICD-10-CM | POA: Diagnosis present

## 2023-12-13 DIAGNOSIS — N898 Other specified noninflammatory disorders of vagina: Secondary | ICD-10-CM

## 2023-12-13 NOTE — Progress Notes (Signed)
   NURSE VISIT- VAGINITIS/STD  SUBJECTIVE:  Kaitlin Levy is a 23 y.o. G2P0020 GYN patientfemale here for a vaginal swab for vaginitis screening, STD screen.  She reports the following symptoms:  vaginal discharge, itching and irritation  for several days. Denies abnormal vaginal bleeding, significant pelvic pain, fever, or UTI symptoms.  OBJECTIVE:  There were no vitals taken for this visit.  Appears well, in no apparent distress  ASSESSMENT: Vaginal swab for vaginitis screening & STD screening. Pt also requested HIV and RPR labs.  PLAN: Self-collected vaginal probe for Gonorrhea, Chlamydia, Trichomonas, Bacterial Vaginosis, Yeast sent to lab. HIV & RPR labs also. Treatment: to be determined once results are received Follow-up as needed if symptoms persist/worsen, or new symptoms develop  [name]  12/13/2023 3:40 PM

## 2023-12-14 LAB — HIV ANTIBODY (ROUTINE TESTING W REFLEX): HIV Screen 4th Generation wRfx: NONREACTIVE

## 2023-12-14 LAB — RPR: RPR Ser Ql: NONREACTIVE

## 2023-12-15 ENCOUNTER — Telehealth: Payer: Self-pay

## 2023-12-15 ENCOUNTER — Telehealth: Payer: Self-pay | Admitting: Adult Health

## 2023-12-15 ENCOUNTER — Other Ambulatory Visit: Payer: Self-pay | Admitting: Adult Health

## 2023-12-15 LAB — CERVICOVAGINAL ANCILLARY ONLY
Bacterial Vaginitis (gardnerella): POSITIVE — AB
Candida Glabrata: NEGATIVE
Candida Vaginitis: NEGATIVE
Chlamydia: NEGATIVE
Comment: NEGATIVE
Comment: NEGATIVE
Comment: NEGATIVE
Comment: NEGATIVE
Comment: NEGATIVE
Comment: NORMAL
Neisseria Gonorrhea: NEGATIVE
Trichomonas: NEGATIVE

## 2023-12-15 MED ORDER — METRONIDAZOLE 0.75 % VA GEL: 1.0000 | Freq: Every day | VAGINAL | 0 refills | Status: DC

## 2023-12-15 NOTE — Telephone Encounter (Signed)
Patient calling again stating that she doesn't live in [place] and needs [facility] [address] Ginette Otto, pt is unable to check her Kaitlin Levy and is asking for someone to call her at [phone]

## 2023-12-15 NOTE — Telephone Encounter (Signed)
Pt states she can take the Metrogel. Med will be sent to pharmacy. Pt aware. JSY

## 2023-12-15 NOTE — Telephone Encounter (Signed)
Patient called and stated that she would like for a nurse to call her, one of the medications that was prescribed to her; she is allergic to.

## 2023-12-15 NOTE — Telephone Encounter (Signed)
Rx sent in for metrogel

## 2023-12-15 NOTE — Progress Notes (Signed)
+  BV she says she can use metrogel, rx sent

## 2023-12-15 NOTE — Telephone Encounter (Signed)
Pt states she can use Metrogel. Thanks! JSY

## 2024-01-09 ENCOUNTER — Ambulatory Visit (HOSPITAL_BASED_OUTPATIENT_CLINIC_OR_DEPARTMENT_OTHER): Payer: BC Managed Care – PPO | Admitting: Family Medicine

## 2024-01-25 ENCOUNTER — Ambulatory Visit (HOSPITAL_BASED_OUTPATIENT_CLINIC_OR_DEPARTMENT_OTHER): Admitting: Family Medicine

## 2024-02-08 ENCOUNTER — Ambulatory Visit

## 2024-02-08 ENCOUNTER — Other Ambulatory Visit (HOSPITAL_COMMUNITY)
Admission: RE | Admit: 2024-02-08 | Discharge: 2024-02-08 | Disposition: A | Discharge status: Home / Self Care | Source: Ambulatory Visit | Attending: Obstetrics & Gynecology | Admitting: Obstetrics & Gynecology

## 2024-02-08 ENCOUNTER — Ambulatory Visit: Admitting: *Deleted

## 2024-02-08 DIAGNOSIS — N898 Other specified noninflammatory disorders of vagina: Secondary | ICD-10-CM | POA: Insufficient documentation

## 2024-02-08 NOTE — Progress Notes (Signed)
   NURSE VISIT- VAGINITIS/STD/POC  SUBJECTIVE:  Kaitlin Levy is a 23 y.o. G2P0020 GYN patientfemale here for a vaginal swab for vaginitis screening.  She reports the following symptoms: discharge described as white and local irritation. Denies abnormal vaginal bleeding, significant pelvic pain, fever, or UTI symptoms.  OBJECTIVE:  There were no vitals taken for this visit.  Appears well, in no apparent distress  ASSESSMENT: Vaginal swab for vaginitis screening  PLAN: Self-collected vaginal probe for Gonorrhea, Chlamydia, Trichomonas, Bacterial Vaginosis, Yeast sent to lab Treatment: to be determined once results are received Follow-up as needed if symptoms persist/worsen, or new symptoms develop  Jobe Marker  02/08/2024 4:13 PM

## 2024-02-09 LAB — CERVICOVAGINAL ANCILLARY ONLY
Bacterial Vaginitis (gardnerella): POSITIVE — AB
Candida Glabrata: NEGATIVE
Candida Vaginitis: NEGATIVE
Chlamydia: NEGATIVE
Comment: NEGATIVE
Comment: NEGATIVE
Comment: NEGATIVE
Comment: NEGATIVE
Comment: NEGATIVE
Comment: NORMAL
Neisseria Gonorrhea: NEGATIVE
Trichomonas: NEGATIVE

## 2024-02-10 ENCOUNTER — Other Ambulatory Visit: Payer: Self-pay | Admitting: Adult Health

## 2024-02-13 ENCOUNTER — Other Ambulatory Visit: Payer: Self-pay | Admitting: Adult Health

## 2024-02-13 MED ORDER — METRONIDAZOLE 0.75 % VA GEL: 1.0000 | Freq: Every day | VAGINAL | 0 refills | Status: DC

## 2024-02-13 NOTE — Progress Notes (Signed)
Will rx metrogel  

## 2024-02-27 ENCOUNTER — Ambulatory Visit (HOSPITAL_BASED_OUTPATIENT_CLINIC_OR_DEPARTMENT_OTHER): Payer: BC Managed Care – PPO | Admitting: Family Medicine

## 2024-03-19 ENCOUNTER — Other Ambulatory Visit (HOSPITAL_BASED_OUTPATIENT_CLINIC_OR_DEPARTMENT_OTHER): Payer: Self-pay | Admitting: Family Medicine

## 2024-03-19 ENCOUNTER — Ambulatory Visit (HOSPITAL_BASED_OUTPATIENT_CLINIC_OR_DEPARTMENT_OTHER): Admitting: Family Medicine

## 2024-03-19 ENCOUNTER — Encounter (HOSPITAL_BASED_OUTPATIENT_CLINIC_OR_DEPARTMENT_OTHER): Payer: Self-pay | Admitting: Family Medicine

## 2024-03-19 VITALS — BP 109/54 | HR 81 | Resp 16 | Ht 61.0 in | Wt 157.0 lb

## 2024-03-19 DIAGNOSIS — Z13220 Encounter for screening for lipoid disorders: Secondary | ICD-10-CM

## 2024-03-19 DIAGNOSIS — R6889 Other general symptoms and signs: Secondary | ICD-10-CM | POA: Insufficient documentation

## 2024-03-19 DIAGNOSIS — E663 Overweight: Secondary | ICD-10-CM | POA: Insufficient documentation

## 2024-03-19 NOTE — Patient Instructions (Addendum)
 Calorie Count: 1400-1500 calories per day   Protein : 70-80 g/protein per day

## 2024-03-19 NOTE — Progress Notes (Signed)
 New Patient Office Visit  Subjective:   Kaitlin Levy July 08, 2001 03/19/2024  Chief Complaint  Patient presents with   Establish Care    HPI: Kaitlin Levy presents today to establish care at [facility] at [facility]. Introduced to Publishing rights manager role and practice setting.  All questions answered.   Last PCP: None Last annual physical: N/a  Concerns: See below    WEIGHT MANAGEMENT: Kaitlin Levy presents for weight management. She would like to obtain help with weight loss. She is working two jobs and states she will stress eat junk foods. She does not cook meals. She has lost weight in the past year with increasing exercise. She states she finds herself mindlessly eating and drinking energy drinks during the day. She is concerned about thyroid disease and iron deficiency due to cravings of ice, and feelings of being cold frequently.   Adhering to healthy diet: No Current dietary plan: None Regular exercise regimen: Yes, walks frequently with jobs and work   Medications tried in the past: None   Wt Readings from Last 3 Encounters:  03/19/24 157 lb (71.2 kg)  04/13/23 178 lb 9.2 oz (81 kg)  03/16/23 180 lb (81.6 kg)     The following portions of the patient's history were reviewed and updated as appropriate: past medical history, past surgical history, family history, social history, allergies, medications, and problem list.   Patient Active Problem List   Diagnosis Date Noted   Overweight (BMI 25.0-29.9) 03/19/2024   Cold intolerance 03/19/2024   Family planning 03/16/2023   Contraceptive management 04/28/2015   Past Medical History:  Diagnosis Date   ADHD (attention deficit hyperactivity disorder)    ADD   BV (bacterial vaginosis) 01/14/2020   Chlamydia 08/31/2016   Contraceptive management 04/28/2015   Vaginal discharge 04/28/2015   Past Surgical History:  Procedure Laterality Date   INDUCED ABORTION      09/2019   TONSILLECTOMY     Family History  Problem Relation Age of Onset   Bipolar disorder Mother    Arthritis Mother    Other Mother        back problems   Hypertension Maternal Grandmother    Diabetes Maternal Grandmother        borderline   Arthritis Maternal Grandmother    COPD Maternal Grandfather    Cancer Maternal Grandfather        lung   Social History   Socioeconomic History   Marital status: Single    Spouse name: Not on file   Number of children: Not on file   Years of education: Not on file   Highest education level: Not on file  Occupational History   Not on file  Tobacco Use   Smoking status: Every Day    Types: E-cigarettes    Passive exposure: Never   Smokeless tobacco: Never   Tobacco comments:    QUITNC info attached to AVS   Vaping Use   Vaping status: Every Day  Substance and Sexual Activity   Alcohol use: Yes    Comment: occ   Drug use: Yes    Types: Marijuana    Comment: once a month   Sexual activity: Yes    Birth control/protection: None, Condom  Other Topics Concern   Not on file  Social History Narrative   Not on file   Social Drivers of Health   Financial Resource Strain: Low Risk  (03/16/2023)   Overall Financial  Resource Strain (CARDIA)    Difficulty of Paying Living Expenses: Not hard at all  Food Insecurity: No Food Insecurity (03/16/2023)   Hunger Vital Sign    Worried About Running Out of Food in the Last Year: Never true    Ran Out of Food in the Last Year: Never true  Transportation Needs: No Transportation Needs (03/16/2023)   PRAPARE - Administrator, Civil Service (Medical): No    Lack of Transportation (Non-Medical): No  Physical Activity: Insufficiently Active (03/16/2023)   Exercise Vital Sign    Days of Exercise per Week: 1 day    Minutes of Exercise per Session: 40 min  Stress: No Stress Concern Present (03/16/2023)   [employer] of Occupational Health - Occupational Stress Questionnaire     Feeling of Stress : Only a little  Social Connections: Moderately Isolated (03/16/2023)   Social Connection and Isolation Panel [NHANES]    Frequency of Communication with Friends and Family: More than three times a week    Frequency of Social Gatherings with Friends and Family: Twice a week    Attends Religious Services: 1 to 4 times per year    Active Member of [employer] or Organizations: No    Attends Banker Meetings: Never    Marital Status: Never married  Intimate Partner Violence: Not At Risk (03/16/2023)   Humiliation, Afraid, Rape, and Kick questionnaire    Fear of Current or Ex-Partner: No    Emotionally Abused: No    Physically Abused: No    Sexually Abused: No   Outpatient Medications Prior to Visit  Medication Sig Dispense Refill   ibuprofen  (ADVIL ,MOTRIN ) 600 MG tablet Take 1 tablet (600 mg total) by mouth every 6 (six) hours as needed. (Patient not taking: Reported on 02/08/2024) 30 tablet 0   metroNIDAZOLE  (METROGEL ) 0.75 % vaginal gel Place 1 Applicatorful vaginally at bedtime. (Patient not taking: Reported on 03/19/2024) 70 g 0   triamcinolone cream (KENALOG) 0.5 % APPLY TO AFFECTED AREAS AS NEEDED (Patient not taking: Reported on 02/08/2024)  2   No facility-administered medications prior to visit.   Allergies  Allergen Reactions   Metronidazole  Hives, Diarrhea and Itching    Reports swelling of tongue, but no SOB   Lactose Intolerance (Gi) Diarrhea    ROS: A complete ROS was performed with pertinent positives/negatives noted in the HPI. The remainder of the ROS are negative.   Objective:   Today's Vitals   03/19/24 1330  BP: (!) 109/54  Pulse: 81  Resp: 16  Weight: 157 lb (71.2 kg)  Height: 5\' 1"  (1.549 m)  PainSc: 0-No pain    GENERAL: Well-appearing, in NAD. Well nourished.  SKIN: Pink, warm and dry.  Head: Normocephalic. NECK: Trachea midline. Full ROM w/o pain or tenderness.  RESPIRATORY: Chest wall symmetrical. Respirations even and  non-labored.  MSK: Muscle tone and strength appropriate for age.  NEUROLOGIC: No motor or sensory deficits. Steady, even gait. C2-C12 intact.  PSYCH/MENTAL STATUS: Alert, oriented x 3. Cooperative, appropriate mood and affect.      Assessment & Plan:  1. Cold intolerance (Primary) Discussed possible causes such as anemia due to iron deficiency, thyroid disease. No signs of acute bleeding. Will obtain fasting labs and check TSH and iron. Will review results with patient when available.   - CBC with Differential/Platelet; Future - Iron, TIBC and Ferritin Panel; Future - TSH Rfx on Abnormal to Free T4; Future  2. Overweight (BMI 25.0-29.9) Recommend dietary changes such as  caloric deficit and increasing her sources of protein daily. Reviewed high protein meals, snacks, and exercise with patient for assist with weight loss. Will obtain labs to evaluate for NAFLD, HLD, and thyroid disease that may contribute to weight gain.  - Comprehensive metabolic panel with GFR; Future - Lipid panel; Future - TSH Rfx on Abnormal to Free T4; Future  3. Screening for lipid disorders Will obtain LP as part of screening for upcoming AE.  - Lipid panel; Future   Patient to reach out to office if new, worrisome, or unresolved symptoms arise or if no improvement in patient's condition. Patient verbalized understanding and is agreeable to treatment plan. All questions answered to patient's satisfaction.    Return in about 6 weeks (around 04/30/2024) for ANNUAL PHYSICAL.    Nonda Bays, [place]

## 2024-04-06 ENCOUNTER — Telehealth (HOSPITAL_BASED_OUTPATIENT_CLINIC_OR_DEPARTMENT_OTHER): Payer: Self-pay | Admitting: Family Medicine

## 2024-04-06 ENCOUNTER — Encounter (HOSPITAL_BASED_OUTPATIENT_CLINIC_OR_DEPARTMENT_OTHER): Payer: Self-pay | Admitting: Family Medicine

## 2024-04-06 DIAGNOSIS — L700 Acne vulgaris: Secondary | ICD-10-CM | POA: Insufficient documentation

## 2024-04-06 MED ORDER — TRETINOIN 0.025 % EX CREA: TOPICAL_CREAM | Freq: Every day | CUTANEOUS | 2 refills | Status: AC

## 2024-04-06 NOTE — Telephone Encounter (Signed)
 A total of 5 minutes were spent on this encounter today including review of patient's request and phone call, reviewing patient's previous records, documenting in the record and ordering medications.

## 2024-04-06 NOTE — Telephone Encounter (Signed)
 Copied from CRM [id number]. Topic: Clinical - Medication Question >> Apr 06, 2024 10:53 AM Donald Frost wrote: Reason for CRM: The patient called in stating she saw her provider a couple of weeks ago and mentioned to her that she gets break outs especially around her face. She called in today requesting a prescription for Tretinoin cream to be [facility] into [facility]  [id number] [place] [place] - [address]  Phone: [phone] Fax: [phone]   Please assist patient further

## 2024-04-10 ENCOUNTER — Ambulatory Visit (HOSPITAL_BASED_OUTPATIENT_CLINIC_OR_DEPARTMENT_OTHER): Admitting: Family Medicine

## 2024-05-04 ENCOUNTER — Telehealth: Admitting: Physician Assistant

## 2024-05-04 ENCOUNTER — Ambulatory Visit: Payer: Self-pay

## 2024-05-04 DIAGNOSIS — R3989 Other symptoms and signs involving the genitourinary system: Secondary | ICD-10-CM

## 2024-05-04 MED ORDER — NITROFURANTOIN MONOHYD MACRO 100 MG PO CAPS: 100.0000 mg | ORAL_CAPSULE | Freq: Two times a day (BID) | ORAL | 0 refills | Status: DC

## 2024-05-04 NOTE — Patient Instructions (Addendum)
 Kaitlin Levy, thank you for joining Angelia Kelp, PA-C for today's virtual visit.  While this provider is not your primary care provider (PCP), if your PCP is located in our provider database this encounter information will be shared with them immediately following your visit.   A [facility] MyChart account gives you access to today's visit and all your visits, tests, and labs performed at [facility]  click here if you don't have a [name] account or go to [email or web address]  Consent: (Patient) Kaitlin Levy provided verbal consent for this virtual visit at the beginning of the encounter.  Current Medications:  Current Outpatient Medications:    nitrofurantoin, macrocrystal-monohydrate, (MACROBID) 100 MG capsule, Take 1 capsule (100 mg total) by mouth 2 (two) times daily., Disp: 10 capsule, Rfl: 0   tretinoin  (RETIN-A ) 0.025 % cream, Apply topically at bedtime. Do not apply to area around eyes. Reduce use if irritation occurs., Disp: 45 g, Rfl: 2   Medications ordered in this encounter:  Meds ordered this encounter  Medications   nitrofurantoin, macrocrystal-monohydrate, (MACROBID) 100 MG capsule    Sig: Take 1 capsule (100 mg total) by mouth 2 (two) times daily.    Dispense:  10 capsule    Refill:  0    Supervising Provider:   Corine Dice [[id number]]     *If you need refills on other medications prior to your next appointment, please contact your pharmacy*  Follow-Up: Call back or seek an in-person evaluation if the symptoms worsen or if the condition fails to improve as anticipated.  [facility] [phone]  Other Instructions  Urinary Tract Infection, Female A urinary tract infection (UTI) is an infection in your urinary tract. The urinary tract is made up of organs that make, store, and get rid of pee (urine) in your body. These organs include: The kidneys. The ureters. The bladder. The urethra. What are the  causes? Most UTIs are caused by germs called bacteria. They may be in or near your genitals. These germs grow and cause swelling in your urinary tract. What increases the risk? You're more likely to get a UTI if: You're a female. The urethra is shorter in females than in males. You have a soft tube called a catheter that drains your pee. You can't control when you pee or poop. You have trouble peeing because of: A kidney stone. A urinary blockage. A nerve condition that affects your bladder. Not getting enough to drink. You're sexually active. You use a birth control inside your vagina, like spermicide. You're pregnant. You have low levels of the hormone estrogen in your body. You're an older adult. You're also more likely to get a UTI if you have other health problems. These may include: Diabetes. A weak immune system. Your immune system is your body's defense system. Sickle cell disease. Injury of the spine. What are the signs or symptoms? Symptoms may include: Needing to pee right away. Peeing small amounts often. Pain or burning when you pee. Blood in your pee. Pee that smells bad or odd. Pain in your belly or lower back. You may also: Feel confused. This may be the first symptom in older adults. Vomit. Not feel hungry. Feel tired or easily annoyed. Have a fever or chills. How is this diagnosed? A UTI is diagnosed based on your medical history and an exam. You may also have other tests. These may include: Pee tests. Blood tests. Tests for sexually transmitted infections (STIs). If  you've had more than one UTI, you may need to have imaging studies done to find out why you keep getting them. How is this treated? A UTI can be treated by: Taking antibiotics or other medicines. Drinking enough fluid to keep your pee pale yellow. In rare cases, a UTI can cause a very bad condition called sepsis. Sepsis may be treated in the hospital. Follow these instructions at  home: Medicines Take your medicines only as told by your health care provider. If you were given antibiotics, take them as told by your provider. Do not stop taking them even if you start to feel better. General instructions Make sure you: Pee often and fully. Do not hold your pee for a long time. Wipe from front to back after you pee or poop. Use each tissue only once when you wipe. Pee after you have sex. Do not douche or use sprays or powders in your genital area. Contact a health care provider if: Your symptoms don't get better after 1-2 days of taking antibiotics. Your symptoms go away and then come back. You have a fever or chills. You vomit or feel like you may vomit. Get help right away if: You have very bad pain in your back or lower belly. You faint. This information is not intended to replace advice given to you by your health care provider. Make sure you discuss any questions you have with your health care provider. Document Revised: 10/12/2023 Document Reviewed: 02/04/2023 Elsevier Patient Education  [employer].   If you have been instructed to have an in-person evaluation today at a local Urgent Care facility, please use the link below. It will take you to a list of all of our available [facility], including address, phone number and hours of operation. Please do not delay care.  [facility]  If you or a family member do not have a primary care provider, use the link below to schedule a visit and establish care. When you choose a [facility] primary care physician or advanced practice provider, you gain a long-term partner in health. Find a Primary Care Provider  Learn more about [facility]'s in-office and virtual care options: [facility] - Get Care Now

## 2024-05-04 NOTE — Progress Notes (Signed)
 Virtual Visit Consent   Kaitlin Levy, you are scheduled for a virtual visit with a [facility] provider today. Just as with appointments in the office, your consent must be obtained to participate. Your consent will be active for this visit and any virtual visit you may have with one of our providers in the next 365 days. If you have a MyChart account, a copy of this consent can be sent to you electronically.  As this is a virtual visit, video technology does not allow for your provider to perform a traditional examination. This may limit your provider's ability to fully assess your condition. If your provider identifies any concerns that need to be evaluated in person or the need to arrange testing (such as labs, EKG, etc.), we will make arrangements to do so. Although advances in technology are sophisticated, we cannot ensure that it will always work on either your end or our end. If the connection with a video visit is poor, the visit may have to be switched to a telephone visit. With either a video or telephone visit, we are not always able to ensure that we have a secure connection.  By engaging in this virtual visit, you consent to the provision of healthcare and authorize for your insurance to be billed (if applicable) for the services provided during this visit. Depending on your insurance coverage, you may receive a charge related to this service.  I need to obtain your verbal consent now. Are you willing to proceed with your visit today? Kaitlin Levy has provided verbal consent on 05/04/2024 for a virtual visit (video or telephone). Angelia Kelp, PA-C  Date: 05/04/2024 9:53 AM   Virtual Visit via Video Note   I, Angelia Kelp, connected with  Kaitlin Levy  ([id number], July 20, 2001) on 05/04/24 at  9:45 AM EDT by a video-enabled telemedicine application and verified that I am speaking with the correct person using two identifiers.  Location: Patient: Virtual Visit Location  Patient: Home Provider: Virtual Visit Location Provider: Home Office   I discussed the limitations of evaluation and management by telemedicine and the availability of in person appointments. The patient expressed understanding and agreed to proceed.    History of Present Illness: Kaitlin Levy is a 23 y.o. who identifies as a female who was assigned female at birth, and is being seen today for dysuria.  HPI: Urinary Tract Infection  This is a new problem. The current episode started in the past 7 days. The problem occurs every urination. The problem has been gradually worsening. The quality of the pain is described as burning. The pain is mild. There has been no fever. Associated symptoms include frequency, hesitancy, sweats and urgency. Pertinent negatives include no chills, discharge, flank pain, hematuria, nausea, possible pregnancy or vomiting. Associated symptoms comments: Lower abdominal pain/spasm, mild low back pain, urine is cloudy. She has tried increased fluids for the symptoms. The treatment provided no relief. There is no history of recurrent UTIs.     Problems:  Patient Active Problem List   Diagnosis Date Noted   Acne vulgaris 04/06/2024   Overweight (BMI 25.0-29.9) 03/19/2024   Cold intolerance 03/19/2024   Family planning 03/16/2023   Contraceptive management 04/28/2015    Allergies:  Allergies  Allergen Reactions   Metronidazole  Hives, Diarrhea and Itching    Reports swelling of tongue, but no SOB   Lactose Intolerance (Gi) Diarrhea   Medications:  Current Outpatient Medications:    nitrofurantoin, macrocrystal-monohydrate, (MACROBID)  100 MG capsule, Take 1 capsule (100 mg total) by mouth 2 (two) times daily., Disp: 10 capsule, Rfl: 0   tretinoin  (RETIN-A ) 0.025 % cream, Apply topically at bedtime. Do not apply to area around eyes. Reduce use if irritation occurs., Disp: 45 g, Rfl: 2  Observations/Objective: Patient is well-developed, well-nourished in no acute  distress.  Resting comfortably at home.  Head is normocephalic, atraumatic.  No labored breathing.  Speech is clear and coherent with logical content.  Patient is alert and oriented at baseline.    Assessment and Plan: 1. Suspected UTI (Primary) - nitrofurantoin, macrocrystal-monohydrate, (MACROBID) 100 MG capsule; Take 1 capsule (100 mg total) by mouth 2 (two) times daily.  Dispense: 10 capsule; Refill: 0  - Worsening symptoms.  - Will treat empirically with macrobid - May use AZO for bladder spasms - Continue to push fluids.  - Seek in person evaluation for urine culture if symptoms do not improve or if they worsen.    Follow Up Instructions: I discussed the assessment and treatment plan with the patient. The patient was provided an opportunity to ask questions and all were answered. The patient agreed with the plan and demonstrated an understanding of the instructions.  A copy of instructions were sent to the patient via MyChart unless otherwise noted below.    The patient was advised to call back or seek an in-person evaluation if the symptoms worsen or if the condition fails to improve as anticipated.    Angelia Kelp, PA-C

## 2024-05-04 NOTE — Telephone Encounter (Signed)
        FYI Only or Action Required?: Action required by provider: Requests antibiotic be sent to pharmacy.  Patient was last seen in primary care on 03/19/2024 by Nonda Bays, FNP. Called Nurse Triage reporting Urinary symptoms. Symptoms began several days ago. Interventions attempted: Nothing. Symptoms are: gradually worsening.abdominal pain, burning.  Triage Disposition: See Physician Within 24 Hours Pt. Will try [facility]. Patient/caregiver understands and will follow disposition?: Copied from CRM [id number]. Topic: Clinical - Red Word Triage >> May 04, 2024  8:39 AM Everlene Hobby D wrote: Patient thinks she has a UTI and it's a burning sensation when peeing. Says her urine is cloudy. She says she has stomach pains like cramping. [id number] Reason for Disposition  Urinating more frequently than usual (i.e., frequency)  Answer Assessment - Initial Assessment Questions 1. SYMPTOM: What's the main symptom you're concerned about? (e.g., frequency, incontinence)     Burning 2. ONSET: When did the    start?     2 days 3. PAIN: Is there any pain? If Yes, ask: How bad is it? (Scale: 1-10; mild, moderate, severe)     Yes 4. CAUSE: What do you think is causing the symptoms?     UTI 5. OTHER SYMPTOMS: Do you have any other symptoms? (e.g., blood in urine, fever, flank pain, pain with urination)     Abdominal pain 6. PREGNANCY: Is there any chance you are pregnant? When was your last menstrual period?     no  Protocols used: Urinary Symptoms-A-AH

## 2024-05-31 ENCOUNTER — Encounter (HOSPITAL_BASED_OUTPATIENT_CLINIC_OR_DEPARTMENT_OTHER): Admitting: Family Medicine

## 2024-07-16 ENCOUNTER — Telehealth: Admitting: Physician Assistant

## 2024-07-16 DIAGNOSIS — R3989 Other symptoms and signs involving the genitourinary system: Secondary | ICD-10-CM

## 2024-07-16 MED ORDER — NITROFURANTOIN MONOHYD MACRO 100 MG PO CAPS: 100.0000 mg | ORAL_CAPSULE | Freq: Two times a day (BID) | ORAL | 0 refills | Status: DC

## 2024-07-16 NOTE — Progress Notes (Signed)
 Virtual Visit Consent   Kaitlin Levy, you are scheduled for a virtual visit with a [facility] provider today. Just as with appointments in the office, your consent must be obtained to participate. Your consent will be active for this visit and any virtual visit you may have with one of our providers in the next 365 days. If you have a MyChart account, a copy of this consent can be sent to you electronically.  As this is a virtual visit, video technology does not allow for your provider to perform a traditional examination. This may limit your provider's ability to fully assess your condition. If your provider identifies any concerns that need to be evaluated in person or the need to arrange testing (such as labs, EKG, etc.), we will make arrangements to do so. Although advances in technology are sophisticated, we cannot ensure that it will always work on either your end or our end. If the connection with a video visit is poor, the visit may have to be switched to a telephone visit. With either a video or telephone visit, we are not always able to ensure that we have a secure connection.  By engaging in this virtual visit, you consent to the provision of healthcare and authorize for your insurance to be billed (if applicable) for the services provided during this visit. Depending on your insurance coverage, you may receive a charge related to this service.  I need to obtain your verbal consent now. Are you willing to proceed with your visit today? Kaitlin Levy has provided verbal consent on 07/16/2024 for a virtual visit (video or telephone). Kaitlin CHRISTELLA Dickinson, Kaitlin Levy  Date: 07/16/2024 8:39 AM   Virtual Visit via Video Note   I, Kaitlin Levy, connected with  Kaitlin Levy  ([id number], 02-Apr-2001) on 07/16/24 at  8:30 AM EDT by a video-enabled telemedicine application and verified that I am speaking with the correct person using two identifiers.  Location: Patient: Virtual Visit Location  Patient: Home Provider: Virtual Visit Location Provider: Home Office   I discussed the limitations of evaluation and management by telemedicine and the availability of in person appointments. The patient expressed understanding and agreed to proceed.    History of Present Illness: Kaitlin Levy is a 23 y.o. who identifies as a female who was assigned female at birth, and is being seen today for dysuria.  HPI: Urinary Tract Infection  This is a new problem. The current episode started in the past 7 days. The problem occurs every urination. The problem has been unchanged. The quality of the pain is described as burning. The pain is mild. Maximum temperature: hot and cold flashes. There is No history of pyelonephritis. Associated symptoms include flank pain (right), frequency, hesitancy, nausea and urgency. Pertinent negatives include no chills, discharge, hematuria, sweats or vomiting. Associated symptoms comments: Stomach pain. She has tried nothing for the symptoms. The treatment provided no relief.     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 Do not apply to area around eyes. Reduce use if irritation occurs., Disp: 45 g, Rfl: 2  Observations/Objective: Patient is well-developed, well-nourished in no acute distress.  Resting comfortably at home.  Head is normocephalic,  atraumatic.  No labored breathing.  Speech is clear and coherent with logical content.  Patient is alert and oriented at baseline.    Assessment and Plan: 1. Suspected UTI (Primary) - nitrofurantoin , macrocrystal-monohydrate, (MACROBID ) 100 MG capsule; Take 1 capsule (100 mg total) by mouth 2 (two) times daily.  Dispense: 10 capsule; Refill: 0  - Worsening symptoms.  - Will treat empirically with Macrobid  - May use AZO for bladder spasms - Continue to push fluids.  - Seek in person evaluation for urine culture if symptoms do not improve or if they worsen.    Follow Up Instructions: I discussed the assessment and treatment plan with the patient. The patient was provided an opportunity to ask questions and all were answered. The patient agreed with the plan and demonstrated an understanding of the instructions.  A copy of instructions were sent to the patient via MyChart unless otherwise noted below.    The patient was advised to call back or seek an in-person evaluation if the symptoms worsen or if the condition fails to improve as anticipated.    Kaitlin CHRISTELLA Dickinson, Kaitlin Levy

## 2024-07-16 NOTE — Patient Instructions (Signed)
 Kaitlin Levy, thank you for joining Delon CHRISTELLA Dickinson, PA-C for today's virtual visit.  While this provider is not your primary care provider (PCP), if your PCP is located in our provider database this encounter information will be shared with them immediately following your visit.   A [facility] MyChart account gives you access to today's visit and all your visits, tests, and labs performed at [facility]  click here if you don't have a [facility] MyChart account or go to [email or web address]  Consent: (Patient) Kaitlin Levy provided verbal consent for this virtual visit at the beginning of the encounter.  Current Medications:  Current Outpatient Medications:    nitrofurantoin , macrocrystal-monohydrate, (MACROBID ) 100 MG capsule, Take 1 capsule (100 mg total) by mouth 2 (two) times daily., Disp: 10 capsule, Rfl: 0   tretinoin  (RETIN-A ) 0.025 % cream, Apply topically at bedtime. Do not apply to area around eyes. Reduce use if irritation occurs., Disp: 45 g, Rfl: 2   Medications ordered in this encounter:  Meds ordered this encounter  Medications   DISCONTD: nitrofurantoin , macrocrystal-monohydrate, (MACROBID ) 100 MG capsule    Sig: Take 1 capsule (100 mg total) by mouth 2 (two) times daily.    Dispense:  10 capsule    Refill:  0    Supervising Provider:   BLAISE ALEENE KIDD [[id number]]   nitrofurantoin , macrocrystal-monohydrate, (MACROBID ) 100 MG capsule    Sig: Take 1 capsule (100 mg total) by mouth 2 (two) times daily.    Dispense:  10 capsule    Refill:  0    Supervising Provider:   BLAISE ALEENE KIDD [[id number]]     *If you need refills on other medications prior to your next appointment, please contact your pharmacy*  Follow-Up: Call back or seek an in-person evaluation if the symptoms worsen or if the condition fails to improve as anticipated.  [facility] [phone]  Other Instructions Urinary Tract Infection, Female A urinary tract  infection (UTI) is an infection in your urinary tract. The urinary tract is made up of organs that make, store, and get rid of pee (urine) in your body. These organs include: The kidneys. The ureters. The bladder. The urethra. What are the causes? Most UTIs are caused by germs called bacteria. They may be in or near your genitals. These germs grow and cause swelling in your urinary tract. What increases the risk? You're more likely to get a UTI if: You're a female. The urethra is shorter in females than in males. You have a soft tube called a catheter that drains your pee. You can't control when you pee or poop. You have trouble peeing because of: A kidney stone. A urinary blockage. A nerve condition that affects your bladder. Not getting enough to drink. You're sexually active. You use a birth control inside your vagina, like spermicide. You're pregnant. You have low levels of the hormone estrogen in your body. You're an older adult. You're also more likely to get a UTI if you have other health problems. These may include: Diabetes. A weak immune system. Your immune system is your body's defense system. Sickle cell disease. Injury of the spine. What are the signs or symptoms? Symptoms may include: Needing to pee right away. Peeing small amounts often. Pain or burning when you pee. Blood in your pee. Pee that smells bad or odd. Pain in your belly or lower back. You may also: Feel confused. This may be the first symptom in older adults. Vomit. Not  feel hungry. Feel tired or easily annoyed. Have a fever or chills. How is this diagnosed? A UTI is diagnosed based on your medical history and an exam. You may also have other tests. These may include: Pee tests. Blood tests. Tests for sexually transmitted infections (STIs). If you've had more than one UTI, you may need to have imaging studies done to find out why you keep getting them. How is this treated? A UTI can be  treated by: Taking antibiotics or other medicines. Drinking enough fluid to keep your pee pale yellow. In rare cases, a UTI can cause a very bad condition called sepsis. Sepsis may be treated in the hospital. Follow these instructions at home: Medicines Take your medicines only as told by your health care provider. If you were given antibiotics, take them as told by your provider. Do not stop taking them even if you start to feel better. General instructions Make sure you: Pee often and fully. Do not hold your pee for a long time. Wipe from front to back after you pee or poop. Use each tissue only once when you wipe. Pee after you have sex. Do not douche or use sprays or powders in your genital area. Contact a health care provider if: Your symptoms don't get better after 1-2 days of taking antibiotics. Your symptoms go away and then come back. You have a fever or chills. You vomit or feel like you may vomit. Get help right away if: You have very bad pain in your back or lower belly. You faint. This information is not intended to replace advice given to you by your health care provider. Make sure you discuss any questions you have with your health care provider. Document Revised: 10/12/2023 Document Reviewed: 02/04/2023 Elsevier Patient Education  [employer].   If you have been instructed to have an in-person evaluation today at a local Urgent Care facility, please use the link below. It will take you to a list of all of our available [facility], including address, phone number and hours of operation. Please do not delay care.  [facility]  If you or a family member do not have a primary care provider, use the link below to schedule a visit and establish care. When you choose a [facility] primary care physician or advanced practice provider, you gain a long-term partner in health. Find a Primary Care Provider  Learn more about [facility]'s  in-office and virtual care options:  - Get Care Now

## 2024-07-26 ENCOUNTER — Encounter (HOSPITAL_BASED_OUTPATIENT_CLINIC_OR_DEPARTMENT_OTHER): Payer: Self-pay | Admitting: Family Medicine

## 2024-07-26 ENCOUNTER — Encounter (HOSPITAL_BASED_OUTPATIENT_CLINIC_OR_DEPARTMENT_OTHER): Payer: Self-pay

## 2024-07-26 ENCOUNTER — Encounter (HOSPITAL_BASED_OUTPATIENT_CLINIC_OR_DEPARTMENT_OTHER): Admitting: Family Medicine

## 2024-07-26 NOTE — Progress Notes (Signed)
 After interview, pt came out of room and stated she needed to leave to go back to work. Pt requested that her appt be cancelled today and she would reschedule at the front desk on her way out.

## 2024-07-26 NOTE — Progress Notes (Signed)
 This encounter was created in error - please disregard.

## 2024-08-07 ENCOUNTER — Ambulatory Visit (HOSPITAL_BASED_OUTPATIENT_CLINIC_OR_DEPARTMENT_OTHER): Admitting: Family Medicine

## 2024-08-07 ENCOUNTER — Other Ambulatory Visit (HOSPITAL_COMMUNITY)
Admission: RE | Admit: 2024-08-07 | Discharge: 2024-08-07 | Disposition: A | Discharge status: Home / Self Care | Source: Ambulatory Visit | Attending: Family Medicine | Admitting: Family Medicine

## 2024-08-07 ENCOUNTER — Encounter (HOSPITAL_BASED_OUTPATIENT_CLINIC_OR_DEPARTMENT_OTHER): Payer: Self-pay | Admitting: Family Medicine

## 2024-08-07 VITALS — BP 113/68 | HR 77 | Ht 61.0 in | Wt 174.0 lb

## 2024-08-07 DIAGNOSIS — Z0000 Encounter for general adult medical examination without abnormal findings: Secondary | ICD-10-CM | POA: Diagnosis not present

## 2024-08-07 DIAGNOSIS — Z124 Encounter for screening for malignant neoplasm of cervix: Secondary | ICD-10-CM

## 2024-08-07 DIAGNOSIS — R3915 Urgency of urination: Secondary | ICD-10-CM | POA: Diagnosis not present

## 2024-08-07 DIAGNOSIS — Z202 Contact with and (suspected) exposure to infections with a predominantly sexual mode of transmission: Secondary | ICD-10-CM | POA: Insufficient documentation

## 2024-08-07 DIAGNOSIS — Z1159 Encounter for screening for other viral diseases: Secondary | ICD-10-CM

## 2024-08-07 DIAGNOSIS — Z13220 Encounter for screening for lipoid disorders: Secondary | ICD-10-CM

## 2024-08-07 DIAGNOSIS — Z114 Encounter for screening for human immunodeficiency virus [HIV]: Secondary | ICD-10-CM

## 2024-08-07 LAB — POCT URINALYSIS DIP (CLINITEK)
Bilirubin, UA: NEGATIVE
Blood, UA: NEGATIVE
Glucose, UA: NEGATIVE mg/dL
Ketones, POC UA: NEGATIVE mg/dL
Leukocytes, UA: NEGATIVE
Nitrite, UA: NEGATIVE
POC PROTEIN,UA: NEGATIVE
Spec Grav, UA: 1.02 (ref 1.010–1.025)
Urobilinogen, UA: 0.2 U/dL
pH, UA: 7 (ref 5.0–8.0)

## 2024-08-07 NOTE — Progress Notes (Signed)
 Subjective:   Kaitlin Levy 25-Feb-2001  08/07/2024   CC: Chief Complaint  Patient presents with   Annual Exam    Patient is here today for her physical with pap. States she has the same concerns as discussed at prior OV.    HPI: Kaitlin Levy is a 23 y.o. female who presents for a routine health maintenance exam.  Labs collected at time of visit.  RECURRENT UTI:   She states she is having increased urine frequency, urgency, and dysuria for the past year.  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  Patient states it depends on shift as how often she is able to go void. She does wear panty liners due to urgency and frequency. She does drink about 40 oz daily of water. She has had about 3 UTIs in the past year most recently in June and September 1st both treated with Macrobid . Denies symptom improvement with antibiotic.   Patient's last menstrual period was 07/22/2024 (approximate).   HEALTH SCREENINGS: - Vision Screening: up to date - Dental Visits: Recommended - Pap smear: pap done - Breast Exam: Declined  - STD Screening: Ordered today - Mammogram (40+): Not applicable  - Colonoscopy (45+): Not applicable  - Bone Density (65+ or under 65 with predisposing conditions): Not applicable  - Lung CA screening with low-dose CT:  Not applicable Adults age 66-80 who are current cigarette smokers or quit within the last 15 years. Must have 20 pack year history.   Depression and Anxiety Screen done today and results listed below:     08/07/2024    3:54 PM 07/26/2024    1:55 PM 03/19/2024    1:31 PM 03/16/2023    2:42 PM 07/08/2021   10:59 AM  Depression screen PHQ 2/9  Decreased Interest 0 0 0 0 1  Down, Depressed, Hopeless 0 0 0 1 1  PHQ - 2 Score 0 0 0 1 2  Altered sleeping 0 0  2 0  Tired, decreased energy 3 3  3 1   Change in appetite 0 0  0 0  Feeling  bad or failure about yourself  0 0  0 1  Trouble concentrating 0 0  0 1  Moving slowly or fidgety/restless 0 0  2 0  Suicidal thoughts 0 0  0 0  PHQ-9 Score 3 3  8 5   Difficult doing work/chores Not difficult at all Not difficult at all         08/07/2024    3:54 PM 07/26/2024    1:55 PM 03/19/2024    1:31 PM 03/16/2023    2:42 PM  GAD 7 : Generalized Anxiety Score  Nervous, Anxious, on Edge 0 0 0 1  Control/stop worrying 0 0 0 0  Worry too much - different things 0 0 0 1  Trouble relaxing 0 0 0 1  Restless 0 0 0 1  Easily annoyed or irritable 0 0 0 1  Afraid - awful might happen 0 0 0 0  Total GAD 7 Score 0 0 0 5  Anxiety Difficulty Not difficult at all Not difficult at all Not difficult at all     IMMUNIZATIONS: - Tdap: Tetanus vaccination status reviewed: last tetanus booster within 10 years. - HPV: Completed; [facility] - Influenza: Postponed to flu season - Pneumovax: Not applicable - Prevnar 20:  Not applicable - Shingrix (50+): Not applicable   Past medical history, surgical history, medications, allergies, family history and social history reviewed with patient today and changes made to appropriate areas of the chart.   Past Medical History:  Diagnosis Date   ADHD (attention deficit hyperactivity disorder)    ADD   BV (bacterial vaginosis) 01/14/2020   Chlamydia 08/31/2016   Contraceptive management 04/28/2015   Vaginal discharge 04/28/2015    Past Surgical History:  Procedure Laterality Date   INDUCED ABORTION     09/2019   TONSILLECTOMY      Current Outpatient Medications on File Prior to Visit  Medication Sig   tretinoin  (RETIN-A ) 0.025 % cream Apply topically at bedtime. Do not apply to area around eyes. Reduce use if irritation occurs.   nitrofurantoin , macrocrystal-monohydrate, (MACROBID ) 100 MG capsule Take 1 capsule (100 mg total) by mouth 2 (two) times daily.   No current facility-administered medications on file prior to visit.     Allergies  Allergen Reactions   Metronidazole  Hives, Diarrhea and Itching    Reports swelling of tongue, but no SOB   Lactose Intolerance (Gi) Diarrhea     Social History   Socioeconomic History   Marital status: Single    Spouse name: Not on file   Number of children: Not on file   Years of education: Not on file   Highest education level: Not on file  Occupational History   Not on file  Tobacco Use   Smoking status: Every Day    Types: E-cigarettes    Passive exposure: Never   Smokeless tobacco: Never   Tobacco comments:    QUITNC info attached to AVS   Vaping Use   Vaping status: Every Day  Substance and Sexual Activity   Alcohol use: Yes    Comment: occ   Drug use: Yes    Types: Marijuana    Comment: once a month   Sexual activity: Yes    Birth control/protection: None, Condom  Other Topics Concern   Not on file  Social History Narrative   Not on file   Social Drivers of Health   Financial Resource Strain: Low Risk  (03/16/2023)   Overall Financial Resource Strain (CARDIA)    Difficulty of Paying Living Expenses: Not hard at all  Food Insecurity: No Food Insecurity (03/16/2023)   Hunger Vital Sign    Worried About Running Out of Food in the Last Year: Never true    Ran Out of Food in the Last Year: Never true  Transportation Needs: No Transportation Needs (03/16/2023)   PRAPARE - Administrator, Civil Service (Medical): No    Lack of Transportation (Non-Medical): No  Physical Activity: Insufficiently Active (03/16/2023)   Exercise Vital Sign    Days of Exercise per Week: 1 day    Minutes of Exercise per Session: 40 min  Stress: No Stress Concern Present (03/16/2023)   [employer] of Occupational Health - Occupational Stress Questionnaire    Feeling of Stress : Only a little  Social Connections: Moderately Isolated (03/16/2023)   Social Connection and Isolation Panel    Frequency of Communication with Friends and Family: More than three  times a week    Frequency of Social Gatherings with Friends and Family: Twice a week    Attends Religious Services: 1 to 4 times per year    Active Member of [employer] or Organizations: No    Attends Banker Meetings: Never    Marital  Status: Never married  Intimate Partner Violence: Not At Risk (03/16/2023)   Humiliation, Afraid, Rape, and Kick questionnaire    Fear of Current or Ex-Partner: No    Emotionally Abused: No    Physically Abused: No    Sexually Abused: No   Social History   Tobacco Use  Smoking Status Every Day   Types: E-cigarettes   Passive exposure: Never  Smokeless Tobacco Never  Tobacco Comments   QUITNC info attached to AVS    Social History   Substance and Sexual Activity  Alcohol Use Yes   Comment: occ    Family History  Problem Relation Age of Onset   Bipolar disorder Mother    Arthritis Mother    Other Mother        back problems   Hypertension Maternal Grandmother    Diabetes Maternal Grandmother        borderline   Arthritis Maternal Grandmother    COPD Maternal Grandfather    Cancer Maternal Grandfather        lung     ROS: Denies fever, fatigue, unexplained weight loss/gain, chest pain, SHOB, and palpitations. Denies neurological deficits, gastrointestinal or genitourinary complaints, and skin changes.   Objective:   Today's Vitals   08/07/24 1549  BP: 113/68  Pulse: 77  SpO2: 100%  Weight: 174 lb (78.9 kg)  Height: 5' 1 (1.549 m)    GENERAL APPEARANCE: Well-appearing, in NAD. Well nourished.  SKIN: Pink, warm and dry. Turgor normal. No rash, lesion, ulceration, or ecchymoses. Hair evenly distributed.  HEENT: HEAD: Normocephalic.  EYES: PERRLA. EOMI. Lids intact w/o defect. Sclera white, Conjunctiva pink w/o exudate.  EARS: External ear w/o redness, swelling, masses or lesions. EAC clear. TM's intact, translucent w/o bulging, appropriate landmarks visualized. Appropriate acuity to conversational tones.  NOSE: Septum  midline w/o deformity. Nares patent, mucosa pink and non-inflamed w/o drainage. No sinus tenderness.  THROAT: Uvula midline. Oropharynx clear. Tonsils non-inflamed w/o exudate. Oral mucosa pink and moist.  NECK: Supple, Trachea midline. Full ROM w/o pain or tenderness. No lymphadenopathy. Thyroid non-tender w/o enlargement or palpable masses.  BREASTS: Breasts pendulous, symmetrical, and w/o palpable masses. Nipples everted and w/o discharge. No rash or skin retraction. No axillary or supraclavicular lymphadenopathy.  RESPIRATORY: Chest wall symmetrical w/o masses. Respirations even and non-labored. Breath sounds clear to auscultation bilaterally. No wheezes, rales, rhonchi, or crackles. CARDIAC: S1, S2 present, regular rate and rhythm. No gallops, murmurs, rubs, or clicks. PMI w/o lifts, heaves, or thrills. No carotid bruits. Capillary refill <2 seconds. Peripheral pulses 2+ bilaterally. GI: Abdomen soft w/o distention. Normoactive bowel sounds. No palpable masses or tenderness. No guarding or rebound tenderness. Liver and spleen w/o tenderness or enlargement. No CVA tenderness.  GU: External genitalia without erythema, lesions, or masses. No lymphadenopathy. Vaginal mucosa pink and moist without exudate, lesions, or ulcerations. Cervix pink with white thick discharge. Cervical os closed. Uterus and adnexae palpable, not enlarged, and w/o tenderness. No palpable masses.  MSK: Muscle tone and strength appropriate for age, w/o atrophy or abnormal movement.  EXTREMITIES: Active ROM intact, w/o tenderness, crepitus, or contracture. No obvious joint deformities or effusions. No clubbing, edema, or cyanosis.  NEUROLOGIC: CN's II-XII intact. Motor strength symmetrical with no obvious weakness. No sensory deficits. DTR's 2+ symmetric bilaterally. Steady, even gait.  PSYCH/MENTAL STATUS: Alert, oriented x 3. Cooperative, appropriate mood and affect.   Chaperoned by Rosina Ada, DNP Student RN BSN  Results  for orders placed or performed in visit on 08/07/24  POCT URINALYSIS DIP (CLINITEK)   Collection Time: 08/07/24  4:33 PM  Result Value Ref Range   Color, UA yellow yellow   Clarity, UA clear clear   Glucose, UA negative negative mg/dL   Bilirubin, UA negative negative   Ketones, POC UA negative negative mg/dL   Spec Grav, UA 8.979 8.989 - 1.025   Blood, UA negative negative   pH, UA 7.0 5.0 - 8.0   POC PROTEIN,UA negative negative, trace   Urobilinogen, UA 0.2 0.2 or 1.0 E.U./dL   Nitrite, UA Negative Negative   Leukocytes, UA Negative Negative    Assessment & Plan:  1. Annual physical exam (Primary) Discussed preventative screenings, vaccines, and healthy lifestyle with patient. Labs obtained as part of AE.  - CBC with Differential/Platelet - Comprehensive metabolic panel with GFR - TSH  2. Screening for lipid disorders - Lipid panel  3. Encounter for Papanicolaou smear for cervical cancer screening STD testing added on to pap smear per patient request.  - Cytology - PAP  4. Encounter for assessment of sexually transmitted disease exposure Asymptomatic, patient requested testing.  - RPR - Cervicovaginal ancillary only  5. Encounter for hepatitis C screening test for low risk patient Asymptomatic, patient requested testing.  - Hepatitis C antibody  6. Encounter for screening for human immunodeficiency virus (HIV) Asymptomatic, patient requested testing.  - HIV Antibody (routine testing w rflx)  7. Urinary urgency Urinalysis was negative for infection in office. May be due to lack of toileting schedule and oral intake. Recommend she increase voiding and clear fluids during the day. Will culture urine to rule out bacterial presence. Vaginal swab also completed per patient request for BV, Yeast possibly contributing.  - Urine Culture - POCT URINALYSIS DIP (CLINITEK)   Orders Placed This Encounter  Procedures   Urine Culture   CBC with Differential/Platelet    Comprehensive metabolic panel with GFR   Lipid panel   TSH   Hepatitis C antibody   HIV Antibody (routine testing w rflx)   RPR   POCT URINALYSIS DIP (CLINITEK)    PATIENT COUNSELING:  - Encouraged a healthy well-balanced diet. Patient may adjust caloric intake to maintain or achieve ideal body weight. May reduce intake of dietary saturated fat and total fat and have adequate dietary potassium and calcium preferably from fresh fruits, vegetables, and low-fat dairy products.   - Advised to avoid cigarette smoking. - Discussed with the patient that most people either abstain from alcohol or drink within safe limits (<=14/week and <=4 drinks/occasion for males, <=7/weeks and <= 3 drinks/occasion for females) and that the risk for alcohol disorders and other health effects rises proportionally with the number of drinks per week and how often a drinker exceeds daily limits. - Discussed cessation/primary prevention of drug use and availability of treatment for abuse.  - Discussed sexually transmitted diseases, avoidance of unintended pregnancy and contraceptive alternatives.  - Stressed the importance of regular exercise - Injury prevention: Discussed safety belts, safety helmets, smoke detector, smoking near bedding or upholstery.  - Dental health: Discussed importance of regular tooth brushing, flossing, and dental visits.   NEXT PREVENTATIVE PHYSICAL DUE IN 1 YEAR.  Return in about 1 year (around 08/07/2025) for ANNUAL PHYSICAL.  Patient to reach out to office if new, worrisome, or unresolved symptoms arise or if no improvement in patient's condition. Patient verbalized understanding and is agreeable to treatment plan. All questions answered to patient's satisfaction.    Thersia Schuyler Stark, [place]

## 2024-08-08 ENCOUNTER — Ambulatory Visit (HOSPITAL_BASED_OUTPATIENT_CLINIC_OR_DEPARTMENT_OTHER): Payer: Self-pay | Admitting: Family Medicine

## 2024-08-08 ENCOUNTER — Ambulatory Visit: Payer: Self-pay

## 2024-08-08 ENCOUNTER — Other Ambulatory Visit (HOSPITAL_BASED_OUTPATIENT_CLINIC_OR_DEPARTMENT_OTHER): Payer: Self-pay | Admitting: Family Medicine

## 2024-08-08 ENCOUNTER — Telehealth (HOSPITAL_BASED_OUTPATIENT_CLINIC_OR_DEPARTMENT_OTHER): Payer: Self-pay | Admitting: Family Medicine

## 2024-08-08 DIAGNOSIS — R7989 Other specified abnormal findings of blood chemistry: Secondary | ICD-10-CM

## 2024-08-08 LAB — COMPREHENSIVE METABOLIC PANEL WITH GFR
ALT: 21 IU/L (ref 0–32)
AST: 18 IU/L (ref 0–40)
Albumin: 4.6 g/dL (ref 4.0–5.0)
Alkaline Phosphatase: 64 IU/L (ref 41–116)
BUN/Creatinine Ratio: 9 (ref 9–23)
BUN: 10 mg/dL (ref 6–20)
Bilirubin Total: 0.5 mg/dL (ref 0.0–1.2)
CO2: 22 mmol/L (ref 20–29)
Calcium: 9.5 mg/dL (ref 8.7–10.2)
Chloride: 99 mmol/L (ref 96–106)
Creatinine, Ser: 1.08 mg/dL — ABNORMAL HIGH (ref 0.57–1.00)
Globulin, Total: 2.7 g/dL (ref 1.5–4.5)
Glucose: 75 mg/dL (ref 70–99)
Potassium: 3.8 mmol/L (ref 3.5–5.2)
Sodium: 137 mmol/L (ref 134–144)
Total Protein: 7.3 g/dL (ref 6.0–8.5)
eGFR: 73 mL/min/1.73 (ref >59)

## 2024-08-08 LAB — CBC WITH DIFFERENTIAL/PLATELET
Basophils Absolute: 0 x10E3/uL (ref 0.0–0.2)
Basos: 0 %
EOS (ABSOLUTE): 0.1 x10E3/uL (ref 0.0–0.4)
Eos: 1 %
Hematocrit: 39.5 % (ref 34.0–46.6)
Hemoglobin: 12.7 g/dL (ref 11.1–15.9)
Immature Grans (Abs): 0 x10E3/uL (ref 0.0–0.1)
Immature Granulocytes: 0 %
Lymphocytes Absolute: 2.6 x10E3/uL (ref 0.7–3.1)
Lymphs: 26 %
MCH: 26 pg — ABNORMAL LOW (ref 26.6–33.0)
MCHC: 32.2 g/dL (ref 31.5–35.7)
MCV: 81 fL (ref 79–97)
Monocytes Absolute: 0.8 x10E3/uL (ref 0.1–0.9)
Monocytes: 7 %
Neutrophils Absolute: 6.6 x10E3/uL (ref 1.4–7.0)
Neutrophils: 66 %
Platelets: 318 x10E3/uL (ref 150–450)
RBC: 4.88 x10E6/uL (ref 3.77–5.28)
RDW: 13.2 % (ref 11.7–15.4)
WBC: 10.1 x10E3/uL (ref 3.4–10.8)

## 2024-08-08 LAB — LIPID PANEL
Chol/HDL Ratio: 2.7 ratio (ref 0.0–4.4)
Cholesterol, Total: 163 mg/dL (ref 100–199)
HDL: 61 mg/dL (ref >39)
LDL Chol Calc (NIH): 88 mg/dL (ref 0–99)
Triglycerides: 71 mg/dL (ref 0–149)
VLDL Cholesterol Cal: 14 mg/dL (ref 5–40)

## 2024-08-08 LAB — CERVICOVAGINAL ANCILLARY ONLY
Bacterial Vaginitis (gardnerella): POSITIVE — AB
Candida Glabrata: NEGATIVE
Candida Vaginitis: NEGATIVE
Comment: NEGATIVE
Comment: NEGATIVE
Comment: NEGATIVE
Comment: NEGATIVE
Trichomonas: NEGATIVE

## 2024-08-08 LAB — TSH: TSH: 4.62 u[IU]/mL — ABNORMAL HIGH (ref 0.450–4.500)

## 2024-08-08 LAB — HIV ANTIBODY (ROUTINE TESTING W REFLEX): HIV Screen 4th Generation wRfx: NONREACTIVE

## 2024-08-08 LAB — HEPATITIS C ANTIBODY: Hep C Virus Ab: NONREACTIVE

## 2024-08-08 LAB — RPR: RPR Ser Ql: NONREACTIVE

## 2024-08-08 MED ORDER — CLINDAMYCIN PHOSPHATE 2 % VA CREA: 1.0000 | TOPICAL_CREAM | Freq: Every day | VAGINAL | 0 refills | Status: DC

## 2024-08-08 NOTE — Telephone Encounter (Signed)
 Kaitlin Levy, please advise if you are able to call pt to further discuss labs.

## 2024-08-08 NOTE — Progress Notes (Signed)
 Hi Kaitlin Levy,  Your blood counts are stable. Your electrolytes and liver function are stable. Your creatinine and kidney function was slight decreased and your thyroid function was slightly elevated. I would like you to increase your clear fluid intake and return in 2 weeks for repeat labs. Your cholesterol is well controlled. Your STD check with blood work is negative. Your vaginal swab was positive for BV.   You have an overgrowth of bacteria in your vagina called Bacterial Vaginosis. This is not a sexually transmitted infection.  Sexual partners do not need to be treated, however abstaining from sex or using condoms may prevent recurrence of the overgrowth. Some women have a recurrence of the overgrowth even when fully treated.  Call the office if your symptoms begin again.  Do not douche.  This is associated with decreased cure rates and more bacterial overgrowths. Take the full course of the antibiotic prescribed to you even if you begin to feel better. Do not use latex condoms with clindamycin  as the drug will cause the condom to fail.

## 2024-08-08 NOTE — Telephone Encounter (Signed)
 Patient would like to speak to someone about her kidney function and medication.  We did make her an appointment for labs in 2 weeks. Please advise. Thank you.

## 2024-08-08 NOTE — Telephone Encounter (Signed)
 FYI Only or Action Required?: FYI only for provider.  Patient was last seen in primary care on 08/07/2024 by Knute Thersia Bitters, FNP.  Called Nurse Triage reporting Advice Only.  Symptoms began today.  Interventions attempted: Nothing.  Symptoms are: stable.  Triage Disposition: Information or Advice Only Call  Patient/caregiver understands and will follow disposition?:      Copied from CRM #[id number]. Topic: Clinical - Request for Lab/Test Order >> Aug 08, 2024  2:50 PM Turkey B wrote: Reason for CRM: patient has more questions about lab results Reason for Disposition  Health information question, no triage required and triager able to answer question  Answer Assessment - Initial Assessment Questions 1. REASON FOR CALL: What is the main reason for your call? or How can I best help you?     Pt had additional questions about lab results.  Rn answered all of pt's questions. Did advise her the antibiotic cream was sent to the pharmacy today.  Protocols used: Information Only Call - No Triage-A-AH

## 2024-08-09 LAB — URINE CULTURE: Organism ID, Bacteria: NO GROWTH

## 2024-08-09 NOTE — Progress Notes (Signed)
 Urine culture is negative for infection.

## 2024-08-10 LAB — CYTOLOGY - PAP
Chlamydia: NEGATIVE
Comment: NEGATIVE
Comment: NEGATIVE
Comment: NORMAL
Diagnosis: NEGATIVE
Neisseria Gonorrhea: NEGATIVE
Trichomonas: NEGATIVE

## 2024-08-10 NOTE — Progress Notes (Signed)
 Pap Smear is normal. Health maintenance updated. Repeat pap in 3 years.

## 2024-08-13 ENCOUNTER — Telehealth (HOSPITAL_BASED_OUTPATIENT_CLINIC_OR_DEPARTMENT_OTHER): Payer: Self-pay | Admitting: Family Medicine

## 2024-08-13 ENCOUNTER — Other Ambulatory Visit (HOSPITAL_BASED_OUTPATIENT_CLINIC_OR_DEPARTMENT_OTHER): Payer: Self-pay | Admitting: Family Medicine

## 2024-08-13 MED ORDER — CLINDAMYCIN HCL 300 MG PO CAPS: 300.0000 mg | ORAL_CAPSULE | Freq: Two times a day (BID) | ORAL | 0 refills | Status: AC

## 2024-08-13 NOTE — Telephone Encounter (Signed)
 Prior authorization for the clindamycin  gel was denied:  Denied on September 24 by [facility] Medicaid 2017 Denied. Per the health plan's preferred drug list, at least two preferred drugs must be tried before requesting this drug or tell us  why the member cannot try any preferred alternatives. Please send us  supporting chart notes and lab results. Here is list of preferred alternatives: Cleocin  vaginal ovules, Clindesse vaginal cream, metronidazole  vaginal gel or Nuvessa  vaginal gel. Per our records, the member has already tried metronidazole . Note: Some preferred drug(s) may have quantity limits. Refer to the health plan's preferred drug list for additional details.    Routing to [place] for review.

## 2024-08-13 NOTE — Progress Notes (Signed)
 PCP called patient regarding medication question for treatment of BV. Patietn has allergy listed to Flagyl  so Cleocin  cream had been sent in. She has not been able to pick up due to insurance and would prefer oral clindamycin . Verbalized understanding of side effects and will start probiotic with abx.

## 2024-08-13 NOTE — Telephone Encounter (Signed)
 Copied from CRM [id number]. Topic: Clinical - Prescription Issue >> Aug 13, 2024  3:02 PM Kaitlin Levy wrote: Reason for CRM: Patient states her pharmacy will not give her the prescription for clindamycin  (CLEOCIN ) 2 % vaginal cream. States they waiting on clarification from the doctor before they can release.   Patient can be reached at [phone]

## 2024-08-22 ENCOUNTER — Ambulatory Visit (HOSPITAL_BASED_OUTPATIENT_CLINIC_OR_DEPARTMENT_OTHER)

## 2024-10-05 ENCOUNTER — Ambulatory Visit: Payer: Self-pay

## 2024-10-05 NOTE — Telephone Encounter (Signed)
 FYI Only or Action Required?: Action required by provider: Request for antibiotics.  Patient was last seen in primary care on 08/07/2024 by Knute Thersia Bitters, FNP.  Called Nurse Triage reporting Urinary Frequency.  Symptoms began several days ago.  Interventions attempted: Nothing.  Symptoms are: gradually worsening.  Triage Disposition: See Physician Within 24 Hours  Patient/caregiver understands and will follow disposition?: Unsure          Copied from CRM [id number]. Topic: Clinical - Red Word Triage >> Oct 05, 2024 10:39 AM Winona R wrote: Frequent urination along with burning and pain in lower abd, vaginal discharge yellow in color.Pt would like antibiotics as she believes it is reoccurring BV Reason for Disposition  Unusual vaginal discharge (e.g., bad smelling, yellow, green, or foamy-white)  Answer Assessment - Initial Assessment Questions Pt offered an appointment to be seen today for symptoms at UC she declined and states she does not get off of work until 7 pm. Requesting antibiotics for symptoms to be called in to pharmacy below. Pt requesting a call back regarding her request.   [id number], [place] - [address]  [address], [place] [address]    1. SEVERITY: How bad is the pain?  (e.g., Scale 1-10; mild, moderate, or severe)     Mild  2. PATTERN: Is pain present every time you urinate or just sometimes?      Everytime she urinates  3. ONSET: When did the painful urination start?      A few days ago  4. FEVER: Do you have a fever? If Yes, ask: What is your temperature, how was it measured, and when did it start?     Denies  5. CAUSE: What do you think is causing the painful urination?  (e.g., UTI, scratch, Herpes sore)     BV  6. OTHER SYMPTOMS: Do you have any other symptoms? (e.g., blood in urine, flank pain, genital sores, urgency, vaginal discharge)     Vaginal odor, yellow vaginal discharge, lower abd  pain  Protocols used: Urination Pain - Female-A-AH

## 2024-10-05 NOTE — Telephone Encounter (Signed)
 Called and spoke with pt. Stated to pt due to office about to close as we close at 12pm on Fridays, stated to pt to go to UC for evaluation since the weekend is approaching. Pt verbalized understanding. Nothing further needed.,

## 2024-10-08 ENCOUNTER — Ambulatory Visit: Payer: Self-pay | Admitting: *Deleted

## 2024-10-08 NOTE — Telephone Encounter (Signed)
 Patient requesting call back. Please advise if patient can have vaginal exam while on menstrual cycle. Advised patient no and will notify PCP of other recommendations. Patient does not want to go to UC.     FYI Only or Action Required?: Action required by provider: request for appointment, clinical question for provider, and update on patient condition.  Patient was last seen in primary care on 08/07/2024 by Knute Thersia Bitters, FNP.  Called Nurse Triage reporting Vaginal Discharge.  Symptoms began several days ago.  Interventions attempted: Rest, hydration, or home remedies.  Symptoms are: rapidly worsening.  Triage Disposition: See Physician Within 24 Hours  Patient/caregiver understands and will follow disposition?: No, wishes to speak with PCP                Copied from CRM #[id number]. Topic: Clinical - Red Word Triage >> Oct 08, 2024 11:23 AM Wess RAMAN wrote: Red Word that prompted transfer to Nurse Triage: Patient believes she has BV symptoms. She has discharge, fishy odor, burning during urination Reason for Disposition  MODERATE-SEVERE itching (i.e., interferes with school, work, or sleep)  Answer Assessment - Initial Assessment Questions No available OV until Jan. Recommended UC. Patient does not want to go to UC and would like to see provider in office. Please advise if vaginal exam can be completed while patient on menstrual cycle. Patient requesting call back.      1. SYMPTOM: What's the main symptom you're concerned about? (e.g., pain, itching, dryness)     Pain urination , vaginal itching , discharge white to brown in color. Odor fishy 2. LOCATION: Where is the  sx located? (e.g., inside/outside, left/right)     Na  3. ONSET: When did the  sx   start?      Friday  4. PAIN: Is there any pain? If Yes, ask: How bad is it? (Scale: 1-10; mild, moderate, severe)     Worsening sx.  5. ITCHING: Is there any itching? If Yes, ask: How bad is it?  (Scale: 1-10; mild, moderate, severe)     Yes  6. CAUSE: What do you think is causing the discharge? Have you had the same problem before? What happened then?     BV 7. OTHER SYMPTOMS: Do you have any other symptoms? (e.g., fever, itching, vaginal bleeding, pain with urination, injury to genital area, vaginal foreign body)     No fever, pain urination, fishy odor vaginal discharge, itching. Patient on period now.  8. PREGNANCY: Is there any chance you are pregnant? When was your last menstrual period?     LMP 10/07/24.  Protocols used: Vaginal Symptoms-A-AH

## 2024-10-08 NOTE — Telephone Encounter (Signed)
 Message was sent Friday 11/21. Pt told then that recommendation was to go to UC since the weekend was approaching.  No openings this week. Please advise if you still recommend pt going to UC or what you recommend.

## 2024-10-09 ENCOUNTER — Encounter (HOSPITAL_BASED_OUTPATIENT_CLINIC_OR_DEPARTMENT_OTHER): Payer: Self-pay | Admitting: Family Medicine

## 2024-10-09 ENCOUNTER — Ambulatory Visit (INDEPENDENT_AMBULATORY_CARE_PROVIDER_SITE_OTHER): Admitting: Family Medicine

## 2024-10-09 ENCOUNTER — Other Ambulatory Visit (HOSPITAL_COMMUNITY)
Admission: RE | Admit: 2024-10-09 | Discharge: 2024-10-09 | Disposition: A | Discharge status: Home / Self Care | Source: Ambulatory Visit | Attending: Family Medicine | Admitting: Family Medicine

## 2024-10-09 VITALS — BP 123/72 | HR 85 | Ht 61.0 in | Wt 183.0 lb

## 2024-10-09 DIAGNOSIS — B9689 Other specified bacterial agents as the cause of diseases classified elsewhere: Secondary | ICD-10-CM | POA: Insufficient documentation

## 2024-10-09 DIAGNOSIS — N760 Acute vaginitis: Secondary | ICD-10-CM | POA: Insufficient documentation

## 2024-10-09 LAB — POCT URINALYSIS DIP (CLINITEK)
Bilirubin, UA: NEGATIVE
Glucose, UA: NEGATIVE mg/dL
Ketones, POC UA: NEGATIVE mg/dL
Leukocytes, UA: NEGATIVE
Nitrite, UA: NEGATIVE
POC PROTEIN,UA: NEGATIVE
Spec Grav, UA: 1.01 (ref 1.010–1.025)
Urobilinogen, UA: 0.2 U/dL
pH, UA: 6 (ref 5.0–8.0)

## 2024-10-09 MED ORDER — CLINDAMYCIN HCL 300 MG PO CAPS: 300.0000 mg | ORAL_CAPSULE | Freq: Two times a day (BID) | ORAL | 0 refills | Status: AC

## 2024-10-09 NOTE — Telephone Encounter (Signed)
 Called and spoke with pt. Scheduled pt for an acute visit today 11/25 at 1:30.

## 2024-10-09 NOTE — Progress Notes (Signed)
 Acute Care Office Visit  Subjective:   Kaitlin Levy 2000-11-20 10/09/2024  Chief Complaint  Patient presents with   Vaginal Discharge    Pt has been having vaginal discharge, odor, some frequent urination, and burning. States she has had BV before and believes it is this again.    HPI: URINARY SYMPTOMS Onset: 1wk ago   Fever/chills: no Dysuria: burning Urinary frequency: yes Urgency: no Foul odor: yes Urinary incontinence: no Hematuria: no Abdominal pain: yes. Pt states she is currently on her menstrual cycle and attributes her abdominal pain today to menstrual cramps.  Suprapubic pain/pressure: yes Flank/low back pain: yes Nausea/Vomiting: yes (nausea, no vomiting)   Treatments tried: women's health probiotics   Previous urinary tract infection: yes Recurrent urinary tract infection: yes History of sexually transmitted disease: no  Pt thinks her sx are similar to previous episodes of BV. She reports having BV five times this year. She thinks that her ex-boyfriend is a carrier of BV and that has caused her to become re-infected throughout the year. She reports having good hygiene and avoids using scented soaps/lotions in genital area but states this still happens and its every time after he and I have intercourse.   The following portions of the patient's history were reviewed and updated as appropriate: past medical history, past surgical history, family history, social history, allergies, medications, and problem list.   Patient Active Problem List   Diagnosis Date Noted   Acne vulgaris 04/06/2024   Overweight (BMI 25.0-29.9) 03/19/2024   Cold intolerance 03/19/2024   Family planning 03/16/2023   Contraceptive management 04/28/2015   Past Medical History:  Diagnosis Date   ADHD (attention deficit hyperactivity disorder)    ADD   BV (bacterial vaginosis) 01/14/2020   Chlamydia 08/31/2016   Contraceptive management 04/28/2015   Vaginal discharge  04/28/2015   Past Surgical History:  Procedure Laterality Date   INDUCED ABORTION     09/2019   TONSILLECTOMY     Family History  Problem Relation Age of Onset   Bipolar disorder Mother    Arthritis Mother    Other Mother        back problems   Hypertension Maternal Grandmother    Diabetes Maternal Grandmother        borderline   Arthritis Maternal Grandmother    COPD Maternal Grandfather    Cancer Maternal Grandfather        lung   Outpatient Medications Prior to Visit  Medication Sig Dispense Refill   tretinoin  (RETIN-A ) 0.025 % cream Apply topically at bedtime. Do not apply to area around eyes. Reduce use if irritation occurs. 45 g 2   No facility-administered medications prior to visit.   Allergies  Allergen Reactions   Metronidazole  Hives, Diarrhea and Itching    Reports swelling of tongue, but no SOB   Lactose Intolerance (Gi) Diarrhea    ROS: A complete ROS was performed with pertinent positives/negatives noted in the HPI. The remainder of the ROS are negative.    Objective:   Today's Vitals   10/09/24 1335  BP: 123/72  Pulse: 85  SpO2: 100%  Weight: 183 lb (83 kg)  Height: 5' 1 (1.549 m)    GENERAL: Well-appearing, in NAD. Well nourished.  SKIN: Pink, warm and dry. No rash, lesion, ulceration, or ecchymoses.  Head: Normocephalic. NECK: Trachea midline. Full ROM w/o pain or tenderness.  RESPIRATORY: Chest wall symmetrical. Respirations even and non-labored. CARDIAC: Peripheral pulses 2+ bilaterally.  GI: Abdomen  soft, non-tender. Normoactive bowel sounds. No rebound tenderness. No hepatomegaly or splenomegaly. No CVA tenderness.   MSK: Muscle tone and strength appropriate for age. Joints w/o tenderness, redness, or swelling.  EXTREMITIES: Without clubbing, cyanosis, or edema.  NEUROLOGIC: No motor or sensory deficits. Steady, even gait. C2-C12 intact.  PSYCH/MENTAL STATUS: Alert, oriented x 3. Cooperative, appropriate mood and affect.   Results for  orders placed or performed in visit on 10/09/24  POCT URINALYSIS DIP (CLINITEK)  Result Value Ref Range   Color, UA yellow yellow   Clarity, UA cloudy (A) clear   Glucose, UA negative negative mg/dL   Bilirubin, UA negative negative   Ketones, POC UA negative negative mg/dL   Spec Grav, UA 8.989 8.989 - 1.025   Blood, UA large (A) negative   pH, UA 6.0 5.0 - 8.0   POC PROTEIN,UA negative negative, trace   Urobilinogen, UA 0.2 0.2 or 1.0 E.U./dL   Nitrite, UA Negative Negative   Leukocytes, UA Negative Negative      Assessment & Plan:  1. Bacterial vaginosis (Primary) Urine dipstick negative for UTI. Pt did perform a self swab which we will send out with STD screening added on per pt request. Pt declined offer for pelvic exam today. She is encouraged to continue taking her women's health probiotics, wear cotton underwear, avoid using scented soaps or lotions in the genital area, and abstain from sexual intercourse until her sx clear. Clindamycin  Rx sent to pt's pharmacy.  - POCT URINALYSIS DIP (CLINITEK) - clindamycin  (CLEOCIN ) 300 MG capsule; Take 1 capsule (300 mg total) by mouth 2 (two) times daily.  Dispense: 14 capsule; Refill: 0 - Cervicovaginal ancillary only   Meds ordered this encounter  Medications   clindamycin  (CLEOCIN ) 300 MG capsule    Sig: Take 1 capsule (300 mg total) by mouth 2 (two) times daily.    Dispense:  14 capsule    Refill:  0    Supervising Provider:   DE CUBA, RAYMOND J [[id number]]   Lab Orders         POCT URINALYSIS DIP (CLINITEK)     No images are attached to the encounter or orders placed in the encounter.  Return if symptoms worsen or fail to improve.    Patient to reach out to office if new, worrisome, or unresolved symptoms arise or if no improvement in patient's condition. Patient verbalized understanding and is agreeable to treatment plan. All questions answered to patient's satisfaction.   Kaitlin Stark, FNP-C

## 2024-10-09 NOTE — Progress Notes (Signed)
 Acute Care Office Visit  Subjective:   Kaitlin Levy 2001-09-14 10/09/2024  Chief Complaint  Patient presents with   Vaginal Discharge    Pt has been having vaginal discharge, odor, some frequent urination, and burning. States she has had BV before and believes it is this again.    HPI: URINARY SYMPTOMS Onset: 1wk ago   Fever/chills: no Dysuria: burning Urinary frequency: yes Urgency: no Foul odor: yes Urinary incontinence: no Hematuria: no Abdominal pain: yes. Pt states she is currently on her menstrual cycle and attributes her abdominal pain today to menstrual cramps.  Suprapubic pain/pressure: yes Flank/low back pain: yes Nausea/Vomiting: yes (nausea, no vomiting)   Treatments tried: women's health probiotics   Previous urinary tract infection: yes Recurrent urinary tract infection: yes History of sexually transmitted disease: no  Pt thinks her sx are similar to previous episodes of BV. She reports having BV five times this year. She thinks that her ex-boyfriend is a carrier of BV and that has caused her to become re-infected throughout the year. She reports having good hygiene and avoids using scented soaps/lotions in genital area but states this still happens and its every time after he and I have intercourse.   The following portions of the patient's history were reviewed and updated as appropriate: past medical history, past surgical history, family history, social history, allergies, medications, and problem list.   Patient Active Problem List   Diagnosis Date Noted   Acne vulgaris 04/06/2024   Overweight (BMI 25.0-29.9) 03/19/2024   Cold intolerance 03/19/2024   Family planning 03/16/2023   Contraceptive management 04/28/2015   Past Medical History:  Diagnosis Date   ADHD (attention deficit hyperactivity disorder)    ADD   BV (bacterial vaginosis) 01/14/2020   Chlamydia 08/31/2016   Contraceptive management 04/28/2015   Vaginal discharge  04/28/2015   Past Surgical History:  Procedure Laterality Date   INDUCED ABORTION     09/2019   TONSILLECTOMY     Family History  Problem Relation Age of Onset   Bipolar disorder Mother    Arthritis Mother    Other Mother        back problems   Hypertension Maternal Grandmother    Diabetes Maternal Grandmother        borderline   Arthritis Maternal Grandmother    COPD Maternal Grandfather    Cancer Maternal Grandfather        lung   Outpatient Medications Prior to Visit  Medication Sig Dispense Refill   tretinoin  (RETIN-A ) 0.025 % cream Apply topically at bedtime. Do not apply to area around eyes. Reduce use if irritation occurs. 45 g 2   No facility-administered medications prior to visit.   Allergies  Allergen Reactions   Metronidazole  Hives, Diarrhea and Itching    Reports swelling of tongue, but no SOB   Lactose Intolerance (Gi) Diarrhea    ROS: A complete ROS was performed with pertinent positives/negatives noted in the HPI. The remainder of the ROS are negative.    Objective:   Today's Vitals   10/09/24 1335  BP: 123/72  Pulse: 85  SpO2: 100%  Weight: 83 kg  Height: 5' 1 (1.549 m)    GENERAL: Well-appearing, in NAD. Well nourished.  SKIN: Pink, warm and dry. No rash, lesion, ulceration, or ecchymoses.  Head: Normocephalic. NECK: Trachea midline. Full ROM w/o pain or tenderness.  RESPIRATORY: Chest wall symmetrical. Respirations even and non-labored. CARDIAC: Peripheral pulses 2+ bilaterally.  GI: Abdomen soft, non-tender.  Normoactive bowel sounds. No rebound tenderness. No hepatomegaly or splenomegaly. No CVA tenderness.   MSK: Muscle tone and strength appropriate for age. Joints w/o tenderness, redness, or swelling.  EXTREMITIES: Without clubbing, cyanosis, or edema.  NEUROLOGIC: No motor or sensory deficits. Steady, even gait. C2-C12 intact.  PSYCH/MENTAL STATUS: Alert, oriented x 3. Cooperative, appropriate mood and affect.   Results for orders  placed or performed in visit on 10/09/24  POCT URINALYSIS DIP (CLINITEK)  Result Value Ref Range   Color, UA yellow yellow   Clarity, UA cloudy (A) clear   Glucose, UA negative negative mg/dL   Bilirubin, UA negative negative   Ketones, POC UA negative negative mg/dL   Spec Grav, UA 8.989 8.989 - 1.025   Blood, UA large (A) negative   pH, UA 6.0 5.0 - 8.0   POC PROTEIN,UA negative negative, trace   Urobilinogen, UA 0.2 0.2 or 1.0 E.U./dL   Nitrite, UA Negative Negative   Leukocytes, UA Negative Negative      Assessment & Plan:  1. Bacterial vaginosis (Primary) Urine dipstick negative for UTI. Pt did perform a self swab which we will send out with STD screening added on per pt request. Pt declined offer for pelvic exam today. She is encouraged to continue taking her women's health probiotics, wear cotton underwear, avoid using scented soaps or lotions in the genital area, and abstain from sexual intercourse until her sx clear. Clindamycin  Rx sent to pt's pharmacy.  - POCT URINALYSIS DIP (CLINITEK) - clindamycin  (CLEOCIN ) 300 MG capsule; Take 1 capsule (300 mg total) by mouth 2 (two) times daily.  Dispense: 14 capsule; Refill: 0 - Cervicovaginal ancillary only   Meds ordered this encounter  Medications   clindamycin  (CLEOCIN ) 300 MG capsule    Sig: Take 1 capsule (300 mg total) by mouth 2 (two) times daily.    Dispense:  14 capsule    Refill:  0    Supervising Provider:   DE CUBA, RAYMOND J [[id number]]   Lab Orders         POCT URINALYSIS DIP (CLINITEK)     No images are attached to the encounter or orders placed in the encounter.  Return if symptoms worsen or fail to improve.    Patient to reach out to office if new, worrisome, or unresolved symptoms arise or if no improvement in patient's condition. Patient verbalized understanding and is agreeable to treatment plan. All questions answered to patient's satisfaction.   Treatment plan and recommendation(s) reviewed by  supervising preceptor, Thersia CLEMENTEEN Stark, FNP-C, prior to clinic discharge.    Rosina Ada, BSN, RN DNP Student

## 2024-10-09 NOTE — Progress Notes (Deleted)
 Acute Care Office Visit  Subjective:   Kaitlin Levy 12-02-2000 10/09/2024  No chief complaint on file.   HPI: URINARY SYMPTOMS Onset: ***   Fever/chills: {Blank single:19197::yes,no} Dysuria: {Blank single:19197::yes,no,burning} Urinary frequency: {Blank single:19197::yes,no} Urgency: {Blank single:19197::yes,no} Foul odor: {Blank single:19197::yes,no} Urinary incontinence: {Blank single:19197::yes,no} Hematuria: {Blank single:19197::yes,no} Abdominal pain: {Blank single:19197::yes,no} Suprapubic pain/pressure: {Blank single:19197::yes,no} Flank/low back pain: {Blank single:19197::yes,no} Nausea/Vomiting: {Blank single:19197::yes,no}  Treatments tried: ***  Previous urinary tract infection: {Blank single:19197::yes,no} Recurrent urinary tract infection: {Blank single:19197::yes,no} History of sexually transmitted disease: {Blank single:19197::yes,no}    The following portions of the patient's history were reviewed and updated as appropriate: past medical history, past surgical history, family history, social history, allergies, medications, and problem list.   Patient Active Problem List   Diagnosis Date Noted   Acne vulgaris 04/06/2024   Overweight (BMI 25.0-29.9) 03/19/2024   Cold intolerance 03/19/2024   Family planning 03/16/2023   Contraceptive management 04/28/2015   Past Medical History:  Diagnosis Date   ADHD (attention deficit hyperactivity disorder)    ADD   BV (bacterial vaginosis) 01/14/2020   Chlamydia 08/31/2016   Contraceptive management 04/28/2015   Vaginal discharge 04/28/2015   Past Surgical History:  Procedure Laterality Date   INDUCED ABORTION     09/2019   TONSILLECTOMY     Family History  Problem Relation Age of Onset   Bipolar disorder Mother    Arthritis Mother    Other Mother        back problems   Hypertension Maternal Grandmother    Diabetes Maternal  Grandmother        borderline   Arthritis Maternal Grandmother    COPD Maternal Grandfather    Cancer Maternal Grandfather        lung   Outpatient Medications Prior to Visit  Medication Sig Dispense Refill   tretinoin  (RETIN-A ) 0.025 % cream Apply topically at bedtime. Do not apply to area around eyes. Reduce use if irritation occurs. 45 g 2   No facility-administered medications prior to visit.   Allergies  Allergen Reactions   Metronidazole  Hives, Diarrhea and Itching    Reports swelling of tongue, but no SOB   Lactose Intolerance (Gi) Diarrhea     ROS: A complete ROS was performed with pertinent positives/negatives noted in the HPI. The remainder of the ROS are negative.    Objective:   Today's Vitals   10/09/24 1335  Weight: 183 lb (83 kg)  Height: 5' 1 (1.549 m)    GENERAL: Well-appearing, in NAD. Well nourished.  SKIN: Pink, warm and dry. No rash, lesion, ulceration, or ecchymoses.  Head: Normocephalic. NECK: Trachea midline. Full ROM w/o pain or tenderness. No lymphadenopathy.  EARS: Tympanic membranes are intact, translucent without bulging and without drainage. Appropriate landmarks visualized.  EYES: Conjunctiva clear without exudates. EOMI, PERRL, no drainage present.  NOSE: Septum midline w/o deformity. Nares patent, mucosa pink and non-inflamed w/o drainage. No sinus tenderness.  THROAT: Uvula midline. Oropharynx clear. Tonsils non-inflamed without exudate. Mucous membranes pink and moist.  RESPIRATORY: Chest wall symmetrical. Respirations even and non-labored. Breath sounds clear to auscultation bilaterally.  CARDIAC: S1, S2 present, regular rate and rhythm without murmur or gallops. Peripheral pulses 2+ bilaterally.  MSK: Muscle tone and strength appropriate for age. Joints w/o tenderness, redness, or swelling.  EXTREMITIES: Without clubbing, cyanosis, or edema.  NEUROLOGIC: No motor or sensory deficits. Steady, even gait. C2-C12 intact.  PSYCH/MENTAL  STATUS: Alert, oriented x 3. Cooperative, appropriate mood and affect.  No results found for any visits on 10/09/24.    Assessment & Plan:  ***  No orders of the defined types were placed in this encounter.  Lab Orders  No laboratory test(s) ordered today   No images are attached to the encounter or orders placed in the encounter.  No follow-ups on file.    Patient to reach out to office if new, worrisome, or unresolved symptoms arise or if no improvement in patient's condition. Patient verbalized understanding and is agreeable to treatment plan. All questions answered to patient's satisfaction.    Thersia Schuyler Stark, [place]

## 2024-10-10 ENCOUNTER — Ambulatory Visit (HOSPITAL_BASED_OUTPATIENT_CLINIC_OR_DEPARTMENT_OTHER): Payer: Self-pay | Admitting: Family Medicine

## 2024-10-10 LAB — CERVICOVAGINAL ANCILLARY ONLY
Bacterial Vaginitis (gardnerella): NEGATIVE
Candida Glabrata: NEGATIVE
Candida Vaginitis: NEGATIVE
Chlamydia: NEGATIVE
Comment: NEGATIVE
Comment: NEGATIVE
Comment: NEGATIVE
Comment: NEGATIVE
Comment: NEGATIVE
Comment: NORMAL
Neisseria Gonorrhea: NEGATIVE
Trichomonas: NEGATIVE

## 2024-10-10 NOTE — Progress Notes (Signed)
 Hi Cionna,  Your swab is negative for BV or yeast currently. Given that this is a self swab, it may not have been enough of a sample to correctly demonstrate the likely BV symptoms you are experiencing. If you continue to have symptoms following medication, please reach out to the office. Please take a probiotic daily with the antibiotic and increase clear fluids.

## 2024-12-28 ENCOUNTER — Ambulatory Visit (HOSPITAL_BASED_OUTPATIENT_CLINIC_OR_DEPARTMENT_OTHER): Admitting: Family Medicine

## 2025-08-08 ENCOUNTER — Encounter (HOSPITAL_BASED_OUTPATIENT_CLINIC_OR_DEPARTMENT_OTHER): Admitting: Family Medicine
